# Patient Record
Sex: Female | Born: 1980 | Race: White | Hispanic: No | Marital: Married | Smoking: Never smoker
Health system: Southern US, Community
[De-identification: ages and names within clinical notes are randomized; demographics above are authoritative.]

## PROBLEM LIST (undated history)

## (undated) ENCOUNTER — Inpatient Hospital Stay (HOSPITAL_COMMUNITY): Payer: Self-pay

## (undated) DIAGNOSIS — J45909 Unspecified asthma, uncomplicated: Secondary | ICD-10-CM

## (undated) DIAGNOSIS — Z8619 Personal history of other infectious and parasitic diseases: Secondary | ICD-10-CM

## (undated) DIAGNOSIS — E039 Hypothyroidism, unspecified: Secondary | ICD-10-CM

## (undated) HISTORY — PX: OTHER SURGICAL HISTORY: SHX169

## (undated) HISTORY — DX: Personal history of other infectious and parasitic diseases: Z86.19

## (undated) HISTORY — DX: Hypothyroidism, unspecified: E03.9

## (undated) HISTORY — DX: Unspecified asthma, uncomplicated: J45.909

---
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
## 2007-01-17 ENCOUNTER — Emergency Department (HOSPITAL_COMMUNITY): Admission: EM | Admit: 2007-01-17 | Discharge: 2007-01-17 | Payer: Self-pay | Admitting: Emergency Medicine

## 2008-12-09 ENCOUNTER — Emergency Department (HOSPITAL_COMMUNITY): Admission: EM | Admit: 2008-12-09 | Discharge: 2008-12-09 | Payer: Self-pay | Admitting: Family Medicine

## 2013-05-01 LAB — HM PAP SMEAR

## 2013-07-24 ENCOUNTER — Emergency Department: Payer: Self-pay | Admitting: Emergency Medicine

## 2013-07-24 LAB — CBC WITH DIFFERENTIAL/PLATELET
Basophil #: 0.1 10*3/uL (ref 0.0–0.1)
Basophil %: 0.4 %
EOS ABS: 0.1 10*3/uL (ref 0.0–0.7)
Eosinophil %: 0.5 %
HCT: 41 % (ref 35.0–47.0)
HGB: 14.1 g/dL (ref 12.0–16.0)
LYMPHS ABS: 2.3 10*3/uL (ref 1.0–3.6)
Lymphocyte %: 14.9 %
MCH: 30.2 pg (ref 26.0–34.0)
MCHC: 34.3 g/dL (ref 32.0–36.0)
MCV: 88 fL (ref 80–100)
MONO ABS: 1 x10 3/mm — AB (ref 0.2–0.9)
MONOS PCT: 6.8 %
NEUTROS ABS: 11.8 10*3/uL — AB (ref 1.4–6.5)
NEUTROS PCT: 77.4 %
PLATELETS: 212 10*3/uL (ref 150–440)
RBC: 4.65 10*6/uL (ref 3.80–5.20)
RDW: 12.4 % (ref 11.5–14.5)
WBC: 15.3 10*3/uL — ABNORMAL HIGH (ref 3.6–11.0)

## 2013-07-24 LAB — BASIC METABOLIC PANEL
ANION GAP: 5 — AB (ref 7–16)
BUN: 11 mg/dL (ref 7–18)
CALCIUM: 9 mg/dL (ref 8.5–10.1)
Chloride: 103 mmol/L (ref 98–107)
Co2: 27 mmol/L (ref 21–32)
Creatinine: 0.76 mg/dL (ref 0.60–1.30)
EGFR (African American): >60
EGFR (Non-African Amer.): >60
GLUCOSE: 99 mg/dL (ref 65–99)
Osmolality: 270 (ref 275–301)
POTASSIUM: 3.5 mmol/L (ref 3.5–5.1)
SODIUM: 135 mmol/L — AB (ref 136–145)

## 2013-07-24 LAB — URINALYSIS, COMPLETE
BILIRUBIN, UR: NEGATIVE
GLUCOSE, UR: NEGATIVE mg/dL (ref 0–75)
Ketone: NEGATIVE
NITRITE: NEGATIVE
PH: 6 (ref 4.5–8.0)
PROTEIN: NEGATIVE
Specific Gravity: 1.011 (ref 1.003–1.030)
Squamous Epithelial: 15

## 2013-07-24 LAB — MONONUCLEOSIS SCREEN: Mono Test: NEGATIVE

## 2013-07-25 ENCOUNTER — Emergency Department: Payer: Self-pay | Admitting: Emergency Medicine

## 2013-07-27 LAB — BETA STREP CULTURE(ARMC)

## 2013-07-29 LAB — CULTURE, BLOOD (SINGLE)

## 2013-09-20 ENCOUNTER — Inpatient Hospital Stay (HOSPITAL_COMMUNITY): Payer: No Typology Code available for payment source

## 2013-09-20 ENCOUNTER — Encounter (HOSPITAL_COMMUNITY): Payer: Self-pay | Admitting: Family

## 2013-09-20 ENCOUNTER — Inpatient Hospital Stay (HOSPITAL_COMMUNITY)
Admission: AD | Admit: 2013-09-20 | Discharge: 2013-09-20 | Disposition: A | Discharge status: Home / Self Care | Payer: No Typology Code available for payment source | Source: Ambulatory Visit | Attending: Obstetrics and Gynecology | Admitting: Obstetrics and Gynecology

## 2013-09-20 DIAGNOSIS — O021 Missed abortion: Secondary | ICD-10-CM | POA: Insufficient documentation

## 2013-09-20 LAB — URINE MICROSCOPIC-ADD ON

## 2013-09-20 LAB — URINALYSIS, ROUTINE W REFLEX MICROSCOPIC
Bilirubin Urine: NEGATIVE
GLUCOSE, UA: NEGATIVE mg/dL
Ketones, ur: NEGATIVE mg/dL
LEUKOCYTES UA: NEGATIVE
Nitrite: NEGATIVE
Protein, ur: NEGATIVE mg/dL
SPECIFIC GRAVITY, URINE: 1.01 (ref 1.005–1.030)
Urobilinogen, UA: 0.2 mg/dL (ref 0.0–1.0)
pH: 5.5 (ref 5.0–8.0)

## 2013-09-20 LAB — POCT PREGNANCY, URINE: PREG TEST UR: POSITIVE — AB

## 2013-09-20 LAB — ABO/RH: ABO/RH(D): AB POS

## 2013-09-20 MED ORDER — HYDROCODONE-ACETAMINOPHEN 5-325 MG PO TABS
1.0000 | ORAL_TABLET | Freq: Four times a day (QID) | ORAL | Status: DC | PRN
Start: 2013-09-20 — End: 2014-01-05

## 2013-09-20 NOTE — MAU Note (Signed)
33 yo presents to MAU with c/o vaginal bleeding since 1400 today. Reports she had spotting earlier in the week; last intercourse on Thursday.  She soaked a panty liner this afternoon. She reports pelvic discomfort and intermittent cramping.  She has been seen at [facility] and had an US performed; she was told her EDD is 04/25/2014.

## 2013-09-20 NOTE — MAU Provider Note (Signed)
History     CSN: [id number]  Arrival date and time: 09/20/13 1703   First Provider Initiated Contact with Patient 09/20/13 1749      Chief Complaint  Patient presents with  . Vaginal Bleeding   Vaginal Bleeding    Zoe [name] is a 33 y.o. G1P0 at [redacted]w[redacted]d who presents today with vaginal bleeding. She states that she has been seen at [facility], and had an ultrasound on 09/01/12. She states that was normal and was given an EDC os 04/25/14. She states that on Thursday she had some spotting, and today it was heavier. She states that it is similar to a heavy period. She denies any pain at this tIme. She states her blood type is AB+  History reviewed. No pertinent past medical history.  History reviewed. No pertinent past surgical history.  History reviewed. No pertinent family history.  History  Substance Use Topics  . Smoking status: Never Smoker   . Smokeless tobacco: Not on file  . Alcohol Use: No    Allergies: No Known Allergies  Prescriptions prior to admission  Medication Sig Dispense Refill  . Docosahexaenoic Acid (DHA PO) Take 1 capsule by mouth daily.      . folic acid (FOLVITE) 093 MCG tablet Take 2,400 mcg by mouth daily.      . Prenatal Vit-Fe Fumarate-FA (PRENATAL MULTIVITAMIN) TABS tablet Take 1 tablet by mouth daily at 12 noon.        Review of Systems  Genitourinary: Positive for vaginal bleeding.   Physical Exam   Blood pressure 123/73, pulse 71, temperature 98.2 F (36.8 C), temperature source Oral, resp. rate 14, last menstrual period 05/30/2013.  Physical Exam  Nursing note and vitals reviewed. Constitutional: She is oriented to person, place, and time. She appears well-developed and well-nourished. No distress.  Cardiovascular: Normal rate.   Respiratory: Effort normal.  GI: Soft. There is no tenderness.  Neurological: She is alert and oriented to person, place, and time.  Skin: Skin is warm and dry.  Psychiatric: She has a normal  mood and affect.    MAU Course  Procedures  Results for orders placed during the hospital encounter of 09/20/13 (from the past 24 hour(s))  URINALYSIS, ROUTINE W REFLEX MICROSCOPIC     Status: Abnormal   Collection Time    09/20/13  5:23 PM      Result Value Ref Range   Color, Urine YELLOW  YELLOW   APPearance CLEAR  CLEAR   Specific Gravity, Urine 1.010  1.005 - 1.030   pH 5.5  5.0 - 8.0   Glucose, UA NEGATIVE  NEGATIVE mg/dL   Hgb urine dipstick LARGE (*) NEGATIVE   Bilirubin Urine NEGATIVE  NEGATIVE   Ketones, ur NEGATIVE  NEGATIVE mg/dL   Protein, ur NEGATIVE  NEGATIVE mg/dL   Urobilinogen, UA 0.2  0.0 - 1.0 mg/dL   Nitrite NEGATIVE  NEGATIVE   Leukocytes, UA NEGATIVE  NEGATIVE  URINE MICROSCOPIC-ADD ON     Status: Abnormal   Collection Time    09/20/13  5:23 PM      Result Value Ref Range   Squamous Epithelial / LPF FEW (*) RARE   WBC, UA 0-2  <3 WBC/hpf   RBC / HPF 0-2  <3 RBC/hpf   Bacteria, UA FEW (*) RARE  POCT PREGNANCY, URINE     Status: Abnormal   Collection Time    09/20/13  5:39 PM      Result Value Ref Range  Preg Test, Ur POSITIVE (*) NEGATIVE  ABO/RH     Status: None   Collection Time    09/20/13  6:10 PM      Result Value Ref Range   ABO/RH(D) AB POS     US Ob Comp Less 14 Wks  09/20/2013   CLINICAL DATA:  Pregnant, vaginal bleeding, assess viability ; assigned EGA [redacted] weeks 0 days  EXAM: OBSTETRIC <14 WK [place] AND TRANSVAGINAL OB US  TECHNIQUE: Both transabdominal and transvaginal ultrasound examinations were performed for complete evaluation of the gestation as well as the maternal uterus, adnexal regions, and pelvic cul-de-sac. Transvaginal technique was performed to assess early pregnancy.  COMPARISON:  None  FINDINGS: Intrauterine gestational sac: Visualized/normal in shape.  Yolk sac:  Not visualized  Embryo:  Present  Cardiac Activity: Absent  Heart Rate:  N/A bpm  CRL:   15.7  mm   8 w 0 d  Maternal uterus/adnexae: No subchorionic hemorrhage.  Ovaries  normal sizes and morphology.  No free pelvic fluid.  No adnexal masses.  IMPRESSION: Single intrauterine gestation is present.  However, no fetal cardiac activity identified.  Findings meet definitive criteria for failed pregnancy.  This follows SRU consensus guidelines: Diagnostic Criteria for Nonviable Pregnancy Early in the First Trimester. Alison Stalling J Med [phone].   Electronically Signed   By: Lavonia Dana M.D.   On: 09/20/2013 18:56   US Ob Transvaginal  09/20/2013   CLINICAL DATA:  Pregnant, vaginal bleeding, assess viability ; assigned EGA [redacted] weeks 0 days  EXAM: OBSTETRIC <14 WK [place] AND TRANSVAGINAL OB US  TECHNIQUE: Both transabdominal and transvaginal ultrasound examinations were performed for complete evaluation of the gestation as well as the maternal uterus, adnexal regions, and pelvic cul-de-sac. Transvaginal technique was performed to assess early pregnancy.  COMPARISON:  None  FINDINGS: Intrauterine gestational sac: Visualized/normal in shape.  Yolk sac:  Not visualized  Embryo:  Present  Cardiac Activity: Absent  Heart Rate:  N/A bpm  CRL:   15.7  mm   8 w 0 d  Maternal uterus/adnexae: No subchorionic hemorrhage.  Ovaries normal sizes and morphology.  No free pelvic fluid.  No adnexal masses.  IMPRESSION: Single intrauterine gestation is present.  However, no fetal cardiac activity identified.  Findings meet definitive criteria for failed pregnancy.  This follows SRU consensus guidelines: Diagnostic Criteria for Nonviable Pregnancy Early in the First Trimester. Alison Stalling J Med [phone].   Electronically Signed   By: Lavonia Dana M.D.   On: 09/20/2013 18:56     1806: D/W Dr. Helane Rima, will send for [place] to confirm viability. Will also send AB/RH.  1906: D/W Dr. Helane Rima, ok for dc home at this time. Patient can FU with the office on Monday for further evaluation and management.   Assessment and Plan   1. Missed abortion    Bleeding precations Return to MAU as needed   Medication List          DHA PO  Take 1 capsule by mouth daily.     folic acid 623 MCG tablet  Commonly known as:  FOLVITE  Take 2,400 mcg by mouth daily.     HYDROcodone-acetaminophen 5-325 MG per tablet  Commonly known as:  NORCO/VICODIN  Take 1 tablet by mouth every 6 (six) hours as needed for moderate pain.     prenatal multivitamin Tabs tablet  Take 1 tablet by mouth daily at 12 noon.       Follow-up Information   Follow  up with [facility] (Call Monday morning for an appointment. They will work you in that day. )    Contact information:   [place] [id number] [place], [place] 09/20/2013, 5:59 PM

## 2013-09-20 NOTE — Discharge Instructions (Signed)
Miscarriage A miscarriage is the sudden loss of an unborn baby (fetus) before the 20th week of pregnancy. Most miscarriages happen in the first 3 months of pregnancy. Sometimes, it happens before a woman even knows she is pregnant. A miscarriage is also called a "spontaneous miscarriage" or "early pregnancy loss." Having a miscarriage can be an emotional experience. Talk with your caregiver about any questions you may have about miscarrying, the grieving process, and your future pregnancy plans. CAUSES   Problems with the fetal chromosomes that make it impossible for the baby to develop normally. Problems with the baby's genes or chromosomes are most often the result of errors that occur, by chance, as the embryo divides and grows. The problems are not inherited from the parents.  Infection of the cervix or uterus.   Hormone problems.   Problems with the cervix, such as having an incompetent cervix. This is when the tissue in the cervix is not strong enough to hold the pregnancy.   Problems with the uterus, such as an abnormally shaped uterus, uterine fibroids, or congenital abnormalities.   Certain medical conditions.   Smoking, drinking alcohol, or taking illegal drugs.   Trauma.  Often, the cause of a miscarriage is unknown.  SYMPTOMS   Vaginal bleeding or spotting, with or without cramps or pain.  Pain or cramping in the abdomen or lower back.  Passing fluid, tissue, or blood clots from the vagina. DIAGNOSIS  Your caregiver will perform a physical exam. You may also have an ultrasound to confirm the miscarriage. Blood or urine tests may also be ordered. TREATMENT   Sometimes, treatment is not necessary if you naturally pass all the fetal tissue that was in the uterus. If some of the fetus or placenta remains in the body (incomplete miscarriage), tissue left behind may become infected and must be removed. Usually, a dilation and curettage (D and C) procedure is performed.  During a D and C procedure, the cervix is widened (dilated) and any remaining fetal or placental tissue is gently removed from the uterus.  Antibiotic medicines are prescribed if there is an infection. Other medicines may be given to reduce the size of the uterus (contract) if there is a lot of bleeding.  If you have Rh negative blood and your baby was Rh positive, you will need a Rh immunoglobulin shot. This shot will protect any future baby from having Rh blood problems in future pregnancies. HOME CARE INSTRUCTIONS   Your caregiver may order bed rest or may allow you to continue light activity. Resume activity as directed by your caregiver.  Have someone help with home and family responsibilities during this time.   Keep track of the number of sanitary pads you use each day and how soaked (saturated) they are. Write down this information.   Do not use tampons. Do not douche or have sexual intercourse until approved by your caregiver.   Only take over-the-counter or prescription medicines for pain or discomfort as directed by your caregiver.   Do not take aspirin. Aspirin can cause bleeding.   Keep all follow-up appointments with your caregiver.   If you or your partner have problems with grieving, talk to your caregiver or seek counseling to help cope with the pregnancy loss. Allow enough time to grieve before trying to get pregnant again.  SEEK IMMEDIATE MEDICAL CARE IF:   You have severe cramps or pain in your back or abdomen.  You have a fever.  You pass large blood clots (walnut-sized  or larger) ortissue from your vagina. Save any tissue for your caregiver to inspect.   Your bleeding increases.   You have a thick, bad-smelling vaginal discharge.  You become lightheaded, weak, or you faint.   You have chills.  MAKE SURE YOU:  Understand these instructions.  Will watch your condition.  Will get help right away if you are not doing well or get  worse. Document Released: 12/13/2000 Document Revised: 10/14/2012 Document Reviewed: 08/08/2011 Exit[employer] Patient Information 2014 [employer].  

## 2013-11-11 HISTORY — PX: OTHER SURGICAL HISTORY: SHX169

## 2013-12-23 ENCOUNTER — Encounter (HOSPITAL_COMMUNITY): Payer: Self-pay

## 2014-01-05 ENCOUNTER — Ambulatory Visit (INDEPENDENT_AMBULATORY_CARE_PROVIDER_SITE_OTHER): Payer: No Typology Code available for payment source | Admitting: Family Medicine

## 2014-01-05 ENCOUNTER — Encounter (INDEPENDENT_AMBULATORY_CARE_PROVIDER_SITE_OTHER): Payer: Self-pay

## 2014-01-05 ENCOUNTER — Encounter: Payer: Self-pay | Admitting: Family Medicine

## 2014-01-05 VITALS — BP 136/80 | HR 62 | Temp 98.2°F | Ht 71.5 in | Wt 192.5 lb

## 2014-01-05 DIAGNOSIS — Z23 Encounter for immunization: Secondary | ICD-10-CM

## 2014-01-05 DIAGNOSIS — R238 Other skin changes: Secondary | ICD-10-CM

## 2014-01-05 DIAGNOSIS — N926 Irregular menstruation, unspecified: Secondary | ICD-10-CM

## 2014-01-05 DIAGNOSIS — R208 Other disturbances of skin sensation: Secondary | ICD-10-CM

## 2014-01-05 DIAGNOSIS — K625 Hemorrhage of anus and rectum: Secondary | ICD-10-CM

## 2014-01-05 NOTE — Patient Instructions (Signed)
Let me get your old records assembled and then we'll go from there.  Take care.  Glad to see you today.

## 2014-01-05 NOTE — Progress Notes (Signed)
Pre visit review using our clinic review tool, if applicable. No additional management support is needed unless otherwise documented below in the visit note.  New patient.    Persistently cold hands and feet.  Bilateral sx.  Not painful, but noted by patient and others.  W/o visible changes noted.  No other regional sx.    She has a h/o irregular periods.  Has seen OBGYN clinic prev.  H/o miscarriage earlier this year.  Wants to get pregnant, so wouldn't be a good candidate for OCPs.  D/w pt.  Requesting records.   Due for tetanus shot.    H/o mild exercise induced asthma sx, but never needed inhaler treatment.  H/o PNA mult times in childhood, none recently.    H/o blood in stool.  BRBPR.  No black stools.  No abd pain.  Seems to be diet related, after eating more carbs.  Going on episodically for years.    PMH and SH reviewed  ROS: See HPI, otherwise noncontributory.  Meds, vitals, and allergies reviewed.   GEN: nad, alert and oriented HEENT: mucous membranes moist NECK: supple w/o LA CV: rrr.  no murmur PULM: ctab, no inc wob ABD: soft, +bs EXT: no edema SKIN: no acute rash

## 2014-01-06 ENCOUNTER — Encounter: Payer: Self-pay | Admitting: Family Medicine

## 2014-01-06 DIAGNOSIS — N926 Irregular menstruation, unspecified: Secondary | ICD-10-CM | POA: Insufficient documentation

## 2014-01-06 DIAGNOSIS — K625 Hemorrhage of anus and rectum: Secondary | ICD-10-CM | POA: Insufficient documentation

## 2014-01-06 DIAGNOSIS — R208 Other disturbances of skin sensation: Secondary | ICD-10-CM | POA: Insufficient documentation

## 2014-01-06 NOTE — Assessment & Plan Note (Signed)
I want to get her old records and then proceed from there. She agrees.  We didn't intervene today.

## 2014-01-06 NOTE — Assessment & Plan Note (Signed)
No black stools. No abd pain. Seems to be diet related, after eating more carbs. Going on episodically for years.  I want to get her old records and then proceed from there.  She agrees.

## 2014-01-06 NOTE — Assessment & Plan Note (Signed)
She could have a raynaud's phenomena.  D/w pt.  Normal pulses and cap refill.  Wouldn't intervene at this point.

## 2014-01-08 ENCOUNTER — Encounter: Payer: Self-pay | Admitting: Family Medicine

## 2014-01-19 ENCOUNTER — Telehealth: Payer: Self-pay | Admitting: Family Medicine

## 2014-01-19 ENCOUNTER — Encounter: Payer: Self-pay | Admitting: Family Medicine

## 2014-01-19 DIAGNOSIS — R209 Unspecified disturbances of skin sensation: Secondary | ICD-10-CM

## 2014-01-19 DIAGNOSIS — K921 Melena: Secondary | ICD-10-CM

## 2014-01-19 NOTE — Telephone Encounter (Signed)
Left message on patient's voicemail to return call

## 2014-01-19 NOTE — Telephone Encounter (Signed)
Call pt.  3 issues: With the cold sensation in her extremities, we should check a TSH.  I didn't seen one in the records.  Order is in.  I didn't seen w/u for the irregular menses.  If that continues, then I want her to talk with GYN.  She has had mult episodes of blood in stool, w/o any noted eval prev in the records.  I think it is reasonable to have her talk to GI.  I'll put in the referral if she'll consent to going.  Thanks.

## 2014-01-19 NOTE — Telephone Encounter (Signed)
Patient advised.  Lab appt scheduled. Patient will contact her GYN for irregular menses.  Patient would like a GI referral.

## 2014-01-20 NOTE — Telephone Encounter (Signed)
Ordered. Thanks

## 2014-01-29 ENCOUNTER — Other Ambulatory Visit (INDEPENDENT_AMBULATORY_CARE_PROVIDER_SITE_OTHER): Payer: No Typology Code available for payment source

## 2014-01-29 DIAGNOSIS — R238 Other skin changes: Secondary | ICD-10-CM

## 2014-01-29 DIAGNOSIS — R209 Unspecified disturbances of skin sensation: Secondary | ICD-10-CM

## 2014-01-29 LAB — TSH: TSH: 3.49 u[IU]/mL (ref 0.35–4.50)

## 2014-02-12 ENCOUNTER — Encounter: Payer: Self-pay | Admitting: Gastroenterology

## 2014-02-12 ENCOUNTER — Ambulatory Visit (INDEPENDENT_AMBULATORY_CARE_PROVIDER_SITE_OTHER): Payer: No Typology Code available for payment source | Admitting: Gastroenterology

## 2014-02-12 VITALS — BP 100/60 | HR 66 | Ht 71.5 in | Wt 186.6 lb

## 2014-02-12 DIAGNOSIS — K5900 Constipation, unspecified: Secondary | ICD-10-CM

## 2014-02-12 DIAGNOSIS — K649 Unspecified hemorrhoids: Secondary | ICD-10-CM

## 2014-02-12 DIAGNOSIS — K625 Hemorrhage of anus and rectum: Secondary | ICD-10-CM

## 2014-02-12 MED ORDER — HYDROCORTISONE ACETATE 25 MG RE SUPP: 25.0000 mg | Freq: Two times a day (BID) | RECTAL | Status: DC

## 2014-02-12 NOTE — Patient Instructions (Signed)
We have sent the following medications to your pharmacy for you to pick up at your convenience: Anusol Suppository, please insert one suppository rectally twice daily for ten days.  Please purchase Miralax over the counter and take as directed once daily
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
## 2014-05-04 ENCOUNTER — Encounter: Payer: Self-pay | Admitting: Gastroenterology

## 2014-07-03 NOTE — L&D Delivery Note (Signed)
Delivery Note At 2:23 AM a viable female was delivered via Vaginal, Spontaneous Delivery (Presentation: ;  ).  APGAR: 9 9 weight pending  Placenta status: spontaneously , .  Cord:  with the following complications:none .  Cord pH: not obtained  Anesthesia: None  Episiotomy:  none Lacerations: 2nd with left sulcus  Suture Repair: 2.0 3.0 chromic vicryl Est. Blood Loss (mL):  300  Mom to postpartum.  Baby to Couplet care / Skin to Skin.  Sevag Shearn L 03/21/2015, 2:42 AM

## 2014-08-19 LAB — OB RESULTS CONSOLE HEPATITIS B SURFACE ANTIGEN: Hepatitis B Surface Ag: NEGATIVE

## 2014-08-19 LAB — OB RESULTS CONSOLE GC/CHLAMYDIA
Chlamydia: NEGATIVE
GC PROBE AMP, GENITAL: NEGATIVE

## 2014-08-19 LAB — OB RESULTS CONSOLE HIV ANTIBODY (ROUTINE TESTING): HIV: NONREACTIVE

## 2014-08-19 LAB — OB RESULTS CONSOLE ABO/RH: RH Type: POSITIVE

## 2014-08-19 LAB — OB RESULTS CONSOLE RPR: RPR: NONREACTIVE

## 2014-08-19 LAB — OB RESULTS CONSOLE ANTIBODY SCREEN: Antibody Screen: NEGATIVE

## 2014-08-19 LAB — OB RESULTS CONSOLE RUBELLA ANTIBODY, IGM: Rubella: IMMUNE

## 2015-02-02 IMAGING — CT CT NECK WITH CONTRAST
3 of 5 series · 12 of 33 positions shown, 14 images · IV contrast (isovue)
Comparison: None.

CLINICAL DATA: Sore throat

EXAM:
CT NECK WITH CONTRAST
TECHNIQUE: Multidetector CT imaging of the neck was performed using the
standard protocol following the bolus administration of intravenous
contrast.
CONTRAST:  75 cc of Isovue 370

[Series 4: sag neck · sagittal · 0.46mm/px · 5 of 74 slices shown, 6 images]
[im 25/74  bone]
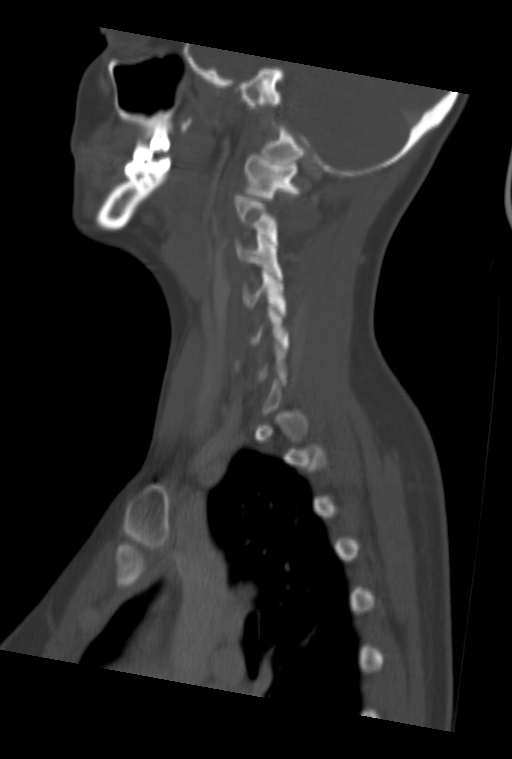
[im 31/74  bone]
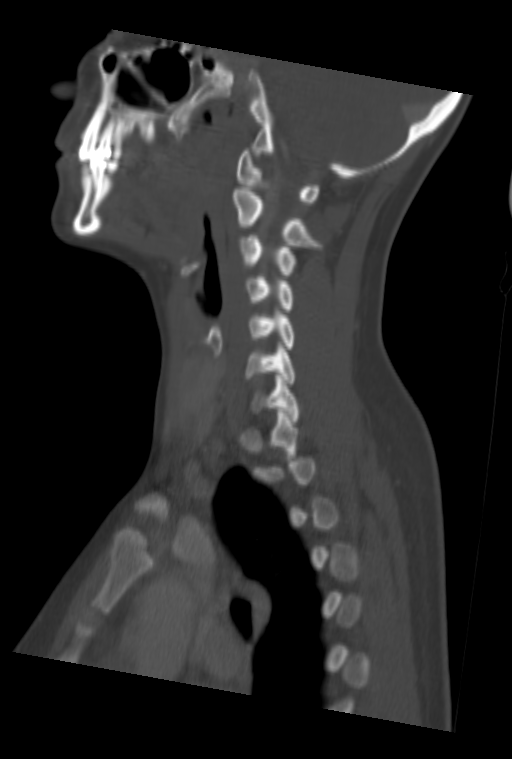
[im 37/74  soft-tissue]
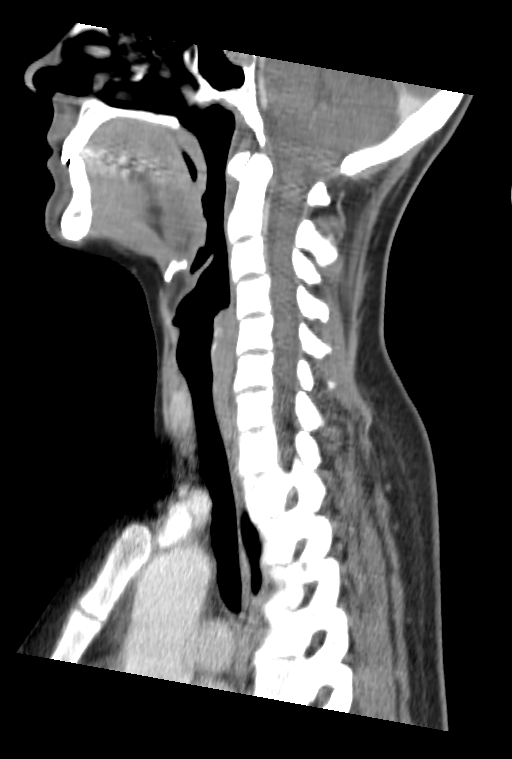
[im 37/74  bone]
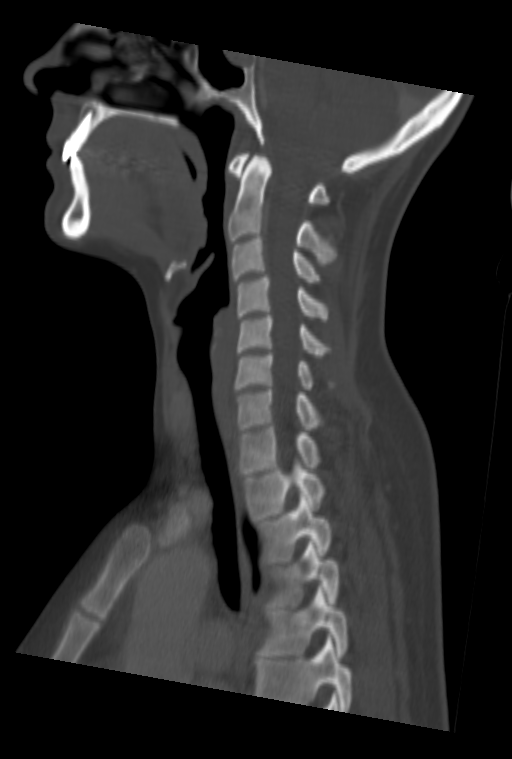
[im 43/74  bone]
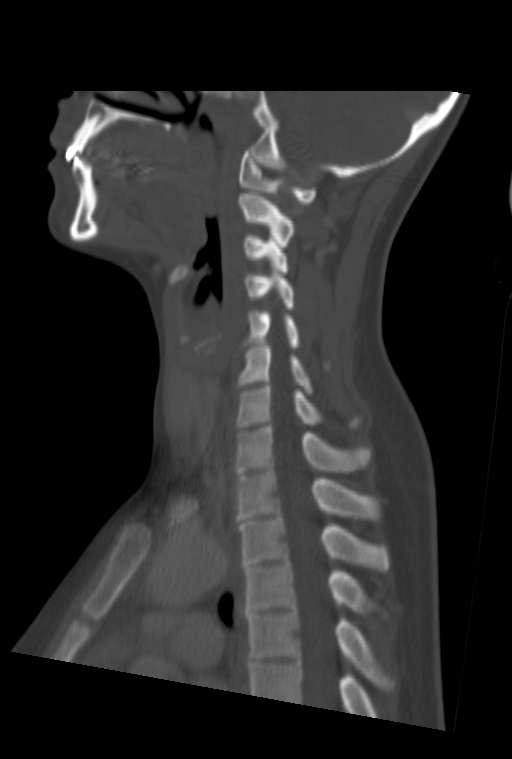
[im 49/74  bone]
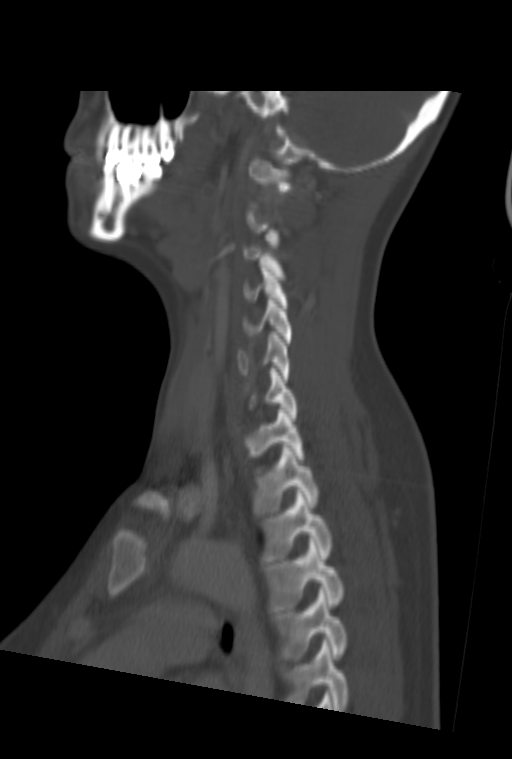

[Series 5: cor neck · coronal · 0.68mm/px · 3 of 93 slices shown]
[im 19/93  bone]
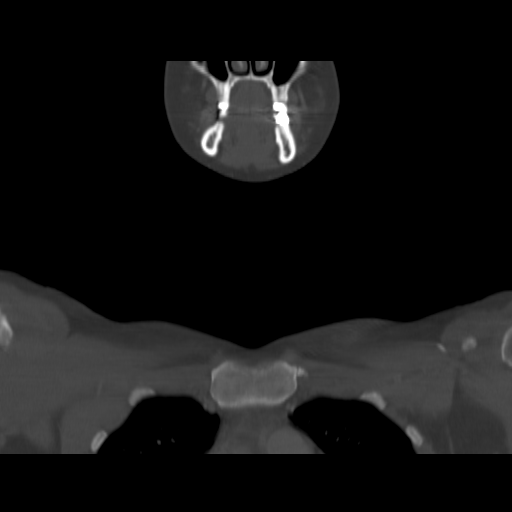
[im 37/93  bone]
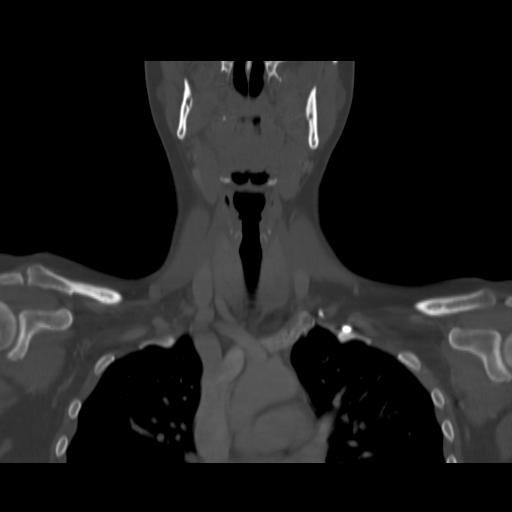
[im 56/93  bone]
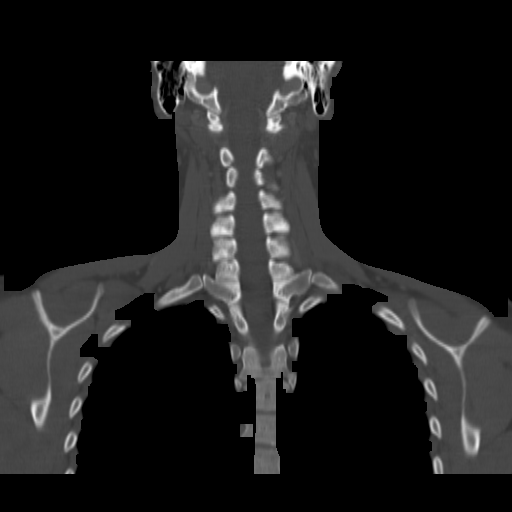

[Series 6: ax oropharynx · axial · 0.44mm/px · z∈[-345,-169]mm · 4 of 151 slices shown, 5 images]
[im 31/151  soft-tissue]
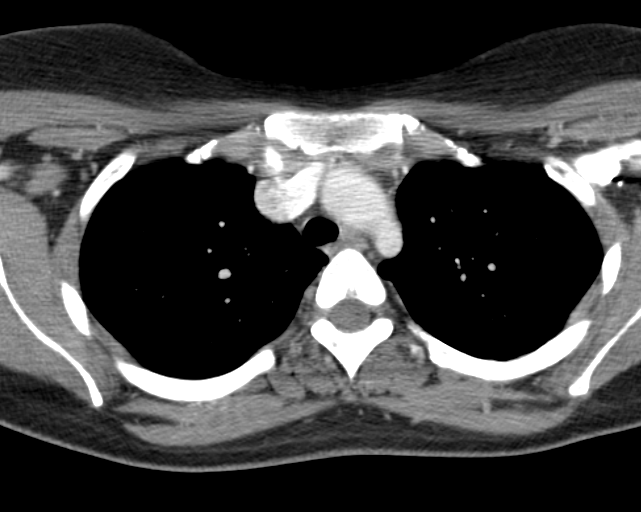
[im 31/151  bone]
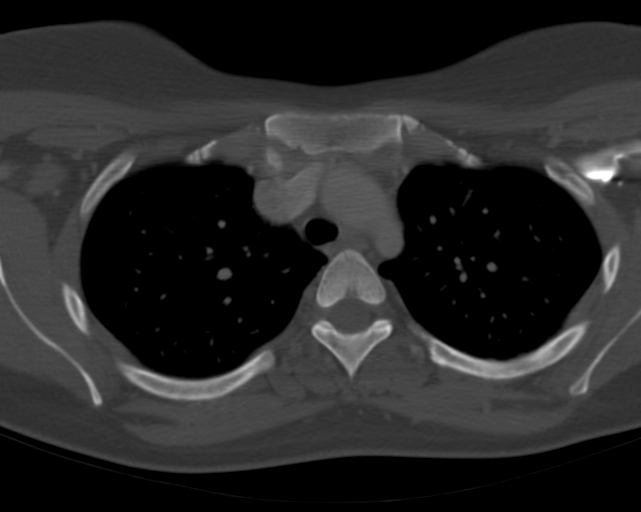
[im 61/151  bone]
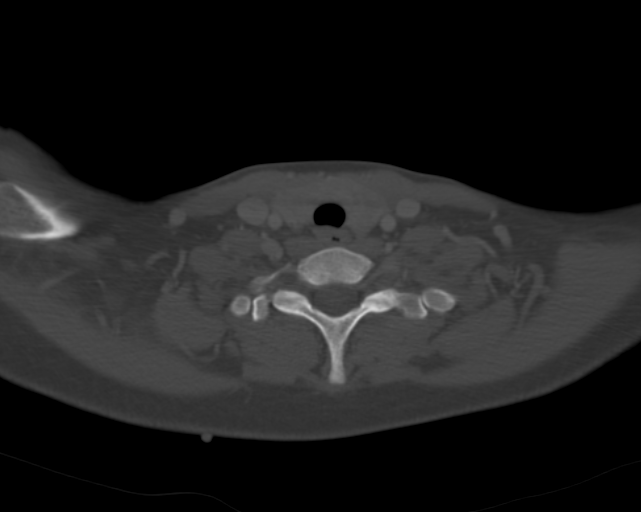
[im 91/151  bone]
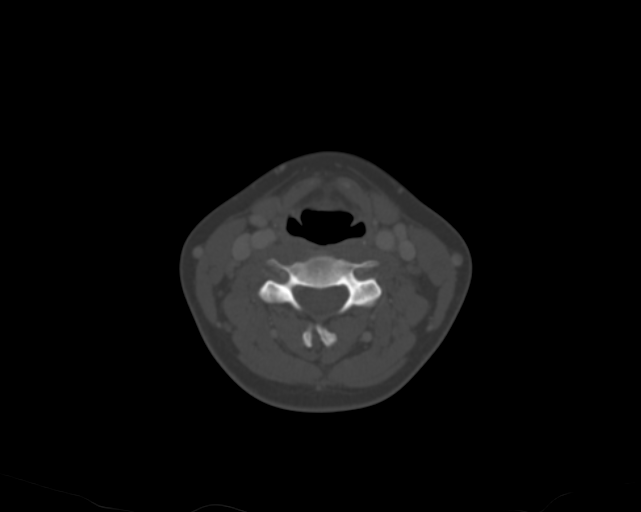
[im 121/151  bone]
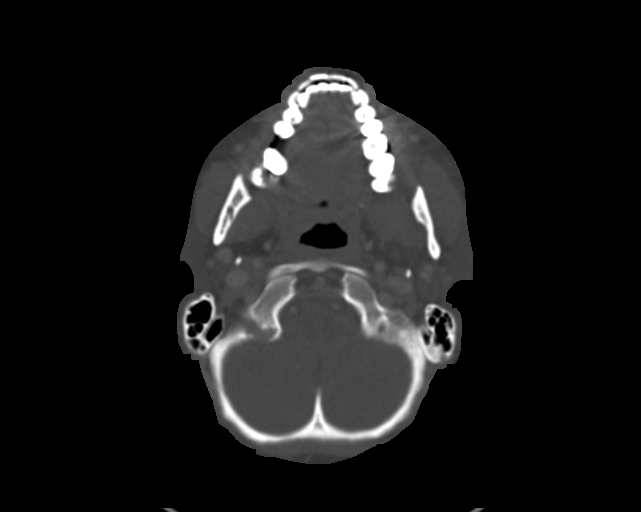

[12 of 33 positions shown; findings below may reference images not displayed]

FINDINGS: Visualized portions of the brain are unremarkable. Visualized orbits
are within normal limits.

Paranasal sinuses are clear.

The salivary glands including the parotid glands and submandibular
glands are normal.

Oral cavity is within normal limits without evidence of mass lesion
or loculated fluid collection. Scattered dental caries are noted.
Oropharynx is within normal limits. The palatine tonsils are
symmetric bilaterally. Subcentimeter calcified tonsillith noted
within the right palatine tonsil. The lingual tonsils and adenoids
are unremarkable. The vallecula is clear. Epiglottis is normal.
Nasopharynx is within normal limits. The hypopharynx and
supraglottic larynx are normal. True vocal cords are symmetric
bilaterally. The subglottic airway is clear. Retropharyngeal soft
tissues are normal.

The thyroid demonstrates a normal contrast enhanced appearance.

The visualized superior mediastinum is within normal limits.

The visualized lung apices are clear.

Normal intravascular enhancement is seen throughout the neck.

No pathologically enlarged lymph nodes are identified within the
neck (series 2, image 26). There is mild asymmetric inflammatory fat
stranding within the left masticator space, indeterminate, but may
represent cellulitis. No loculated fluid collection identified.

Mild reversal of the normal cervical lordosis with apex at C4 is
noted. Osseous structures are otherwise unremarkable. No focal lytic
or blastic osseous lesions are identified.
IMPRESSION: Mild asymmetric inflammatory fat stranding within the left
masticator space/left face, which may represent mild cellulitis.
Correlation with physical exam is recommended. No loculated fluid
collection to suggest abscess. Otherwise unremarkable CT of the
neck.

## 2015-02-23 LAB — OB RESULTS CONSOLE GBS: STREP GROUP B AG: NEGATIVE

## 2015-03-20 ENCOUNTER — Inpatient Hospital Stay (HOSPITAL_COMMUNITY)
Admission: AD | Admit: 2015-03-20 | Discharge: 2015-03-22 | DRG: 775 | Disposition: A | Discharge status: Home / Self Care | Payer: 59 | Source: Ambulatory Visit | Attending: Obstetrics and Gynecology | Admitting: Obstetrics and Gynecology

## 2015-03-20 ENCOUNTER — Encounter (HOSPITAL_COMMUNITY): Payer: Self-pay | Admitting: *Deleted

## 2015-03-20 DIAGNOSIS — Z800 Family history of malignant neoplasm of digestive organs: Secondary | ICD-10-CM | POA: Diagnosis not present

## 2015-03-20 DIAGNOSIS — Z8249 Family history of ischemic heart disease and other diseases of the circulatory system: Secondary | ICD-10-CM

## 2015-03-20 DIAGNOSIS — J45909 Unspecified asthma, uncomplicated: Secondary | ICD-10-CM | POA: Diagnosis present

## 2015-03-20 DIAGNOSIS — R51 Headache: Secondary | ICD-10-CM | POA: Diagnosis present

## 2015-03-20 DIAGNOSIS — Z801 Family history of malignant neoplasm of trachea, bronchus and lung: Secondary | ICD-10-CM

## 2015-03-20 DIAGNOSIS — Z3A40 40 weeks gestation of pregnancy: Secondary | ICD-10-CM | POA: Diagnosis present

## 2015-03-20 DIAGNOSIS — O9952 Diseases of the respiratory system complicating childbirth: Secondary | ICD-10-CM | POA: Diagnosis present

## 2015-03-20 DIAGNOSIS — IMO0001 Reserved for inherently not codable concepts without codable children: Secondary | ICD-10-CM

## 2015-03-20 DIAGNOSIS — O480 Post-term pregnancy: Principal | ICD-10-CM | POA: Diagnosis present

## 2015-03-20 LAB — CBC
HCT: 33 % — ABNORMAL LOW (ref 36.0–46.0)
Hemoglobin: 11.6 g/dL — ABNORMAL LOW (ref 12.0–15.0)
MCH: 32 pg (ref 26.0–34.0)
MCHC: 35.2 g/dL (ref 30.0–36.0)
MCV: 90.9 fL (ref 78.0–100.0)
PLATELETS: 176 10*3/uL (ref 150–400)
RBC: 3.63 MIL/uL — AB (ref 3.87–5.11)
RDW: 13.8 % (ref 11.5–15.5)
WBC: 10.8 10*3/uL — AB (ref 4.0–10.5)

## 2015-03-20 LAB — URINALYSIS, ROUTINE W REFLEX MICROSCOPIC
Bilirubin Urine: NEGATIVE
Glucose, UA: NEGATIVE mg/dL
Ketones, ur: NEGATIVE mg/dL
Nitrite: NEGATIVE
Protein, ur: NEGATIVE mg/dL
SPECIFIC GRAVITY, URINE: 1.01 (ref 1.005–1.030)
UROBILINOGEN UA: 0.2 mg/dL (ref 0.0–1.0)
pH: 6.5 (ref 5.0–8.0)

## 2015-03-20 LAB — TYPE AND SCREEN
ABO/RH(D): AB POS
Antibody Screen: NEGATIVE

## 2015-03-20 LAB — URINE MICROSCOPIC-ADD ON

## 2015-03-20 MED ORDER — OXYCODONE-ACETAMINOPHEN 5-325 MG PO TABS: 1.0000 | ORAL_TABLET | ORAL | Status: DC | PRN

## 2015-03-20 MED ORDER — OXYCODONE-ACETAMINOPHEN 5-325 MG PO TABS: 2.0000 | ORAL_TABLET | ORAL | Status: DC | PRN

## 2015-03-20 MED ORDER — ONDANSETRON HCL 4 MG/2ML IJ SOLN: 4.0000 mg | Freq: Four times a day (QID) | INTRAMUSCULAR | Status: DC | PRN

## 2015-03-20 MED ORDER — OXYTOCIN BOLUS FROM INFUSION
500.0000 mL | INTRAVENOUS | Status: DC
  Administered 2015-03-21: 500 mL via INTRAVENOUS

## 2015-03-20 MED ORDER — OXYTOCIN 40 UNITS IN LACTATED RINGERS INFUSION - SIMPLE MED
62.5000 mL/h | INTRAVENOUS | Status: DC
  Administered 2015-03-21: 62.5 mL/h via INTRAVENOUS
  Filled 2015-03-20: qty 1000

## 2015-03-20 MED ORDER — FLEET ENEMA 7-19 GM/118ML RE ENEM: 1.0000 | ENEMA | RECTAL | Status: DC | PRN

## 2015-03-20 MED ORDER — LIDOCAINE HCL (PF) 1 % IJ SOLN
30.0000 mL | INTRAMUSCULAR | Status: DC | PRN
  Filled 2015-03-20: qty 30

## 2015-03-20 MED ORDER — LACTATED RINGERS IV SOLN: INTRAVENOUS | Status: DC

## 2015-03-20 MED ORDER — LACTATED RINGERS IV SOLN: 500.0000 mL | INTRAVENOUS | Status: DC | PRN

## 2015-03-20 MED ORDER — ACETAMINOPHEN 325 MG PO TABS: 650.0000 mg | ORAL_TABLET | ORAL | Status: DC | PRN

## 2015-03-20 MED ORDER — CITRIC ACID-SODIUM CITRATE 334-500 MG/5ML PO SOLN: 30.0000 mL | ORAL | Status: DC | PRN

## 2015-03-20 NOTE — MAU Note (Signed)
Pt states U/C's started at 0700 today.  They have progressed and are now every 5 minutes since 1800.  Notes bloody show, no abnormal discharge or ROM.  States baby is moving but not as active.  Yesterday VE was 2 cm in office.
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
## 2015-03-21 ENCOUNTER — Encounter (HOSPITAL_COMMUNITY): Payer: Self-pay

## 2015-03-21 LAB — CBC
HEMATOCRIT: 29.9 % — AB (ref 36.0–46.0)
Hemoglobin: 10.2 g/dL — ABNORMAL LOW (ref 12.0–15.0)
MCH: 31.4 pg (ref 26.0–34.0)
MCHC: 34.1 g/dL (ref 30.0–36.0)
MCV: 92 fL (ref 78.0–100.0)
PLATELETS: 181 10*3/uL (ref 150–400)
RBC: 3.25 MIL/uL — AB (ref 3.87–5.11)
RDW: 13.8 % (ref 11.5–15.5)
WBC: 17.5 10*3/uL — AB (ref 4.0–10.5)

## 2015-03-21 LAB — RPR: RPR: NONREACTIVE

## 2015-03-21 MED ORDER — MEASLES, MUMPS & RUBELLA VAC ~~LOC~~ INJ
0.5000 mL | INJECTION | Freq: Once | SUBCUTANEOUS | Status: DC
  Filled 2015-03-21: qty 0.5

## 2015-03-21 MED ORDER — TETANUS-DIPHTH-ACELL PERTUSSIS 5-2.5-18.5 LF-MCG/0.5 IM SUSP: 0.5000 mL | Freq: Once | INTRAMUSCULAR | Status: DC

## 2015-03-21 MED ORDER — ZOLPIDEM TARTRATE 5 MG PO TABS: 5.0000 mg | ORAL_TABLET | Freq: Every evening | ORAL | Status: DC | PRN

## 2015-03-21 MED ORDER — ONDANSETRON HCL 4 MG PO TABS: 4.0000 mg | ORAL_TABLET | ORAL | Status: DC | PRN

## 2015-03-21 MED ORDER — BENZOCAINE-MENTHOL 20-0.5 % EX AERO
1.0000 | INHALATION_SPRAY | CUTANEOUS | Status: DC | PRN
Start: 2015-03-21 — End: 2015-03-22
  Administered 2015-03-21: 1 via TOPICAL
  Filled 2015-03-21: qty 56

## 2015-03-21 MED ORDER — BISACODYL 10 MG RE SUPP: 10.0000 mg | Freq: Every day | RECTAL | Status: DC | PRN

## 2015-03-21 MED ORDER — FLEET ENEMA 7-19 GM/118ML RE ENEM: 1.0000 | ENEMA | Freq: Every day | RECTAL | Status: DC | PRN

## 2015-03-21 MED ORDER — SENNOSIDES-DOCUSATE SODIUM 8.6-50 MG PO TABS
2.0000 | ORAL_TABLET | ORAL | Status: DC
  Administered 2015-03-22: 2 via ORAL
  Filled 2015-03-21: qty 2

## 2015-03-21 MED ORDER — DIBUCAINE 1 % RE OINT: 1.0000 "application " | TOPICAL_OINTMENT | RECTAL | Status: DC | PRN

## 2015-03-21 MED ORDER — MEDROXYPROGESTERONE ACETATE 150 MG/ML IM SUSP: 150.0000 mg | INTRAMUSCULAR | Status: DC | PRN

## 2015-03-21 MED ORDER — BUPIVACAINE HCL (PF) 0.25 % IJ SOLN
30.0000 mL | INTRAMUSCULAR | Status: DC | PRN
  Administered 2015-03-21: 30 mL
  Filled 2015-03-21 (×2): qty 30

## 2015-03-21 MED ORDER — SIMETHICONE 80 MG PO CHEW: 80.0000 mg | CHEWABLE_TABLET | ORAL | Status: DC | PRN

## 2015-03-21 MED ORDER — LANOLIN HYDROUS EX OINT: TOPICAL_OINTMENT | CUTANEOUS | Status: DC | PRN

## 2015-03-21 MED ORDER — ACETAMINOPHEN 325 MG PO TABS: 650.0000 mg | ORAL_TABLET | ORAL | Status: DC | PRN

## 2015-03-21 MED ORDER — DIPHENHYDRAMINE HCL 25 MG PO CAPS: 25.0000 mg | ORAL_CAPSULE | Freq: Four times a day (QID) | ORAL | Status: DC | PRN

## 2015-03-21 MED ORDER — WITCH HAZEL-GLYCERIN EX PADS: 1.0000 "application " | MEDICATED_PAD | CUTANEOUS | Status: DC | PRN

## 2015-03-21 MED ORDER — IBUPROFEN 600 MG PO TABS
600.0000 mg | ORAL_TABLET | Freq: Four times a day (QID) | ORAL | Status: DC
  Administered 2015-03-21: 600 mg via ORAL
  Filled 2015-03-21 (×6): qty 1

## 2015-03-21 MED ORDER — OXYCODONE-ACETAMINOPHEN 5-325 MG PO TABS: 1.0000 | ORAL_TABLET | ORAL | Status: DC | PRN

## 2015-03-21 MED ORDER — PRENATAL MULTIVITAMIN CH
1.0000 | ORAL_TABLET | Freq: Every day | ORAL | Status: DC
  Administered 2015-03-21 – 2015-03-22 (×2): 1 via ORAL
  Filled 2015-03-21 (×2): qty 1

## 2015-03-21 MED ORDER — OXYCODONE-ACETAMINOPHEN 5-325 MG PO TABS: 2.0000 | ORAL_TABLET | ORAL | Status: DC | PRN

## 2015-03-21 MED ORDER — ONDANSETRON HCL 4 MG/2ML IJ SOLN: 4.0000 mg | INTRAMUSCULAR | Status: DC | PRN

## 2015-03-21 NOTE — Lactation Note (Signed)
This note was copied from the chart of [facility]oe [facility]. Lactation Consultation Note  Patient Name: Zoe [name] [id number] Date: 03/21/2015 Reason for consult: Follow-up assessment;Difficult latch Mom reports baby is not latching today. Mom reports baby has been very spitty throughout the day. Now baby is awake and fussy acting hungry. Mom's nipples are slightly erect but with very short shaft bilateral, aerola edema present. Tried several positions and pre-pumping but baby could not sustain the latch. Baby has disorganized suck, tongue thrusting. Short posterior lingual frenulum noted. Demonstrated using hand pump to pre-pump to help with latch. Demonstrated hand expression and received approx 3 ml of colostrum, demonstrated spoon feeding this back to baby to give her an appetizer to see if she would latch. Baby still struggling obtaining/sustaining a latch. Initiated 16 nipple shield, baby could not organize her suck, changed to 24 and after few minutes baby did suckle off/on for about 5 minutes. Some colostrum in nipple shield but the 24 is large for Mom. Changed back to 16 and baby was able to develop a suckling pattern off/on for another 10 minutes then fell asleep. Mom has hand pump to pre-pump. Breast shells given to wear. Advised to watch for early feeding ques, Mom to pre-pump and see if baby will latch. If not use 16 nipple shield, look for colostrum in nipple shield with feeding. Discussed post pumping with using a nipple shield but will address this again in the am, Mom will use hand pump as needed tonight. Basic teaching reviewed, cluster feeding discussed. Lactation brochure left for review, advised of OP services and support group. Encouraged to call for assist as needed. If baby not latching tonight, advised to hand express and spoon feed, ask RN to set up DEBP if baby not latching.   Maternal Data Has patient been taught Hand Expression?: Yes Does the patient have breastfeeding  experience prior to this delivery?: No  Feeding Feeding Type: Breast Fed Length of feed: 15 min (off/on)  LATCH Score/Interventions Latch: Repeated attempts needed to sustain latch, nipple held in mouth throughout feeding, stimulation needed to elicit sucking reflex. (using nipple shield) Intervention(s): Skin to skin;Waking techniques Intervention(s): Adjust position;Assist with latch;Breast massage;Breast compression  Audible Swallowing: A few with stimulation Intervention(s): Skin to skin  Type of Nipple: Flat Intervention(s): Hand pump;Shells  Comfort (Breast/Nipple): Soft / non-tender     Hold (Positioning): Assistance needed to correctly position infant at breast and maintain latch. Intervention(s): Breastfeeding basics reviewed;Support Pillows;Position options;Skin to skin  LATCH Score: 6  Lactation Tools Discussed/Used Tools: Shells;Nipple [place] Fuel;Pump Nipple shield size: 16;24 Shell Type: Inverted Breast pump type: Manual WIC Program: No   Consult Status Consult Status: Follow-up Date: 03/22/15 Follow-up type: In-patient    Katrine Coho 03/21/2015, 10:45 PM

## 2015-03-22 NOTE — Discharge Summary (Signed)
Obstetric Discharge Summary Reason for Admission: onset of labor Prenatal Procedures: none Intrapartum Procedures: spontaneous vaginal delivery Postpartum Procedures: none Complications-Operative and Postpartum: none HEMOGLOBIN  Date Value Ref Range Status  03/21/2015 10.2* 12.0 - 15.0 g/dL Final   HGB  Date Value Ref Range Status  07/24/2013 14.1 12.0-16.0 g/dL Final   HCT  Date Value Ref Range Status  03/21/2015 29.9* 36.0 - 46.0 % Final  07/24/2013 41.0 35.0-47.0 % Final    Physical Exam:  General: alert, cooperative and no distress Lochia: appropriate Uterine Fundus: firm Incision: healing well DVT Evaluation: No evidence of DVT seen on physical exam.  Discharge Diagnoses: Term Pregnancy-delivered  Discharge Information: Date: 03/22/2015 Activity: pelvic rest Diet: routine Medications: None Condition: stable Instructions: refer to practice specific booklet Discharge to: home   Newborn Data: Live born female  Birth Weight: 8 lb (3629 g) APGAR: 9, 9  Home with mother.  LOWE,DAVID C 03/22/2015, 9:19 AM

## 2015-03-23 ENCOUNTER — Ambulatory Visit: Payer: Self-pay

## 2015-03-23 NOTE — Lactation Note (Signed)
This note was copied from the chart of [facility]. Lactation Consultation Note  Baby has had lots of short feedings through the night.  Encouraged mother to undress baby for feedings. Encouraged her to breast feed on both breasts burping in between. Reviewed engorgement care and monitoring voids/stools. Denies soreness or questions.  Patient Name: Zoe [name] [id number] Date: 03/23/2015 Reason for consult: Follow-up assessment   Maternal Data    Feeding Feeding Type: Breast Fed Length of feed: 14 min  LATCH Score/Interventions                      Lactation Tools Discussed/Used     Consult Status Consult Status: Complete    Carlye Grippe 03/23/2015, 8:41 AM

## 2015-03-26 ENCOUNTER — Inpatient Hospital Stay (HOSPITAL_COMMUNITY): Payer: No Typology Code available for payment source

## 2015-10-07 ENCOUNTER — Encounter: Payer: Self-pay | Admitting: Primary Care

## 2015-10-07 ENCOUNTER — Ambulatory Visit (INDEPENDENT_AMBULATORY_CARE_PROVIDER_SITE_OTHER): Payer: 59 | Admitting: Primary Care

## 2015-10-07 VITALS — BP 106/64 | HR 62 | Temp 97.8°F | Ht 71.5 in | Wt 191.4 lb

## 2015-10-07 DIAGNOSIS — R059 Cough, unspecified: Secondary | ICD-10-CM

## 2015-10-07 DIAGNOSIS — R05 Cough: Secondary | ICD-10-CM | POA: Diagnosis not present

## 2015-10-07 MED ORDER — AZITHROMYCIN 250 MG PO TABS
ORAL_TABLET | ORAL | Status: DC
Start: 2015-10-07 — End: 2016-01-26

## 2015-10-07 NOTE — Patient Instructions (Signed)
Start Azithromycin antibiotics. Take 2 tablets by mouth today, then 1 tablet daily for 4 additional days.  This medication could cause diarrhea, decrease in appetite, or rash to your baby.   Continue tylenol as needed for body aches.  Please notify me if you've had no improvement in your symptoms in 3-4 days.  Ensure you are staying hydrated with water.  It was a pleasure meeting you!

## 2015-10-07 NOTE — Progress Notes (Signed)
Pre visit review using our clinic review tool, if applicable. No additional management support is needed unless otherwise documented below in the visit note. 
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
## 2016-01-26 ENCOUNTER — Encounter: Payer: Self-pay | Admitting: Family Medicine

## 2016-01-26 ENCOUNTER — Ambulatory Visit (INDEPENDENT_AMBULATORY_CARE_PROVIDER_SITE_OTHER): Payer: 59 | Admitting: Family Medicine

## 2016-01-26 DIAGNOSIS — R05 Cough: Secondary | ICD-10-CM | POA: Diagnosis not present

## 2016-01-26 DIAGNOSIS — R059 Cough, unspecified: Secondary | ICD-10-CM

## 2016-01-26 MED ORDER — AMOXICILLIN-POT CLAVULANATE 875-125 MG PO TABS: 1.0000 | ORAL_TABLET | Freq: Two times a day (BID) | ORAL | 0 refills | Status: DC

## 2016-01-26 NOTE — Patient Instructions (Signed)
Try to get some rest, drink plenty of fluids, and start augmentin.  Take care.  Glad to see you.  Update me as needed.

## 2016-01-26 NOTE — Progress Notes (Signed)
Pre visit review using our clinic review tool, if applicable. No additional management support is needed unless otherwise documented below in the visit note.  The coldness in her hands improved during pregnancy, returned some in the meantime but not as bad and not in the feet.  D/w pt.  She didn't want intervention at this point.    Cough for the last few weeks, with sputum.  No fevers.  No wheeze.  No vomiting, no diarrhea, no rash.  Now with facial pain, ST- scratchy.  Some pressure in the ears.    Daughter recently with cough, she is in daycare.     Hemorrhoids flare up when her diet is worse.    Fatigue.  Unclear if from being sick, having a young child, or other causes, or some combination of all.    Meds, vitals, and allergies reviewed.   ROS: Per HPI unless specifically indicated in ROS section   GEN: nad, alert and oriented HEENT: mucous membranes moist, tm w/o erythema, nasal exam w/o erythema, clear discharge noted,  OP with cobblestoning NECK: supple w/o LA CV: rrr.   PULM: ctab, no inc wob EXT: no edema

## 2016-01-27 DIAGNOSIS — R059 Cough, unspecified: Secondary | ICD-10-CM | POA: Insufficient documentation

## 2016-01-27 DIAGNOSIS — R05 Cough: Secondary | ICD-10-CM | POA: Insufficient documentation

## 2016-01-27 NOTE — Assessment & Plan Note (Signed)
With sick exposure noted.  D/w pt.  Given duration would treat.  Lungs ctab but would still cover with augmentin.  Should be okay for breast feeding.  D/w pt.  F/u prn.  She agrees.  Nontoxic.

## 2016-04-10 ENCOUNTER — Other Ambulatory Visit: Payer: Self-pay | Admitting: Family Medicine

## 2016-04-10 DIAGNOSIS — Z862 Personal history of diseases of the blood and blood-forming organs and certain disorders involving the immune mechanism: Secondary | ICD-10-CM

## 2016-04-10 DIAGNOSIS — Z131 Encounter for screening for diabetes mellitus: Secondary | ICD-10-CM

## 2016-04-11 ENCOUNTER — Other Ambulatory Visit: Payer: 59

## 2016-04-18 ENCOUNTER — Encounter: Payer: 59 | Admitting: Family Medicine

## 2016-06-08 ENCOUNTER — Other Ambulatory Visit (INDEPENDENT_AMBULATORY_CARE_PROVIDER_SITE_OTHER): Payer: 59

## 2016-06-08 DIAGNOSIS — Z131 Encounter for screening for diabetes mellitus: Secondary | ICD-10-CM | POA: Diagnosis not present

## 2016-06-08 DIAGNOSIS — Z862 Personal history of diseases of the blood and blood-forming organs and certain disorders involving the immune mechanism: Secondary | ICD-10-CM | POA: Diagnosis not present

## 2016-06-08 LAB — CBC WITH DIFFERENTIAL/PLATELET
BASOS ABS: 0 10*3/uL (ref 0.0–0.1)
Basophils Relative: 0.5 % (ref 0.0–3.0)
Eosinophils Absolute: 0.1 10*3/uL (ref 0.0–0.7)
Eosinophils Relative: 1.4 % (ref 0.0–5.0)
HCT: 38.3 % (ref 36.0–46.0)
Hemoglobin: 13.2 g/dL (ref 12.0–15.0)
LYMPHS ABS: 1.6 10*3/uL (ref 0.7–4.0)
Lymphocytes Relative: 30.3 % (ref 12.0–46.0)
MCHC: 34.3 g/dL (ref 30.0–36.0)
MCV: 87.2 fl (ref 78.0–100.0)
MONO ABS: 0.4 10*3/uL (ref 0.1–1.0)
MONOS PCT: 6.7 % (ref 3.0–12.0)
NEUTROS ABS: 3.3 10*3/uL (ref 1.4–7.7)
NEUTROS PCT: 61.1 % (ref 43.0–77.0)
PLATELETS: 209 10*3/uL (ref 150.0–400.0)
RBC: 4.4 Mil/uL (ref 3.87–5.11)
RDW: 13.4 % (ref 11.5–15.5)
WBC: 5.3 10*3/uL (ref 4.0–10.5)

## 2016-06-08 LAB — BASIC METABOLIC PANEL
BUN: 14 mg/dL (ref 6–23)
CALCIUM: 9.1 mg/dL (ref 8.4–10.5)
CO2: 28 meq/L (ref 19–32)
CREATININE: 0.74 mg/dL (ref 0.40–1.20)
Chloride: 107 mEq/L (ref 96–112)
GFR: 95.82 mL/min (ref >60.00)
GLUCOSE: 98 mg/dL (ref 70–99)
Potassium: 4.1 mEq/L (ref 3.5–5.1)
SODIUM: 140 meq/L (ref 135–145)

## 2016-06-16 ENCOUNTER — Encounter: Payer: Self-pay | Admitting: Family Medicine

## 2016-06-16 ENCOUNTER — Ambulatory Visit (INDEPENDENT_AMBULATORY_CARE_PROVIDER_SITE_OTHER): Payer: 59 | Admitting: Family Medicine

## 2016-06-16 VITALS — BP 112/82 | HR 67 | Temp 98.3°F | Ht 71.0 in | Wt 194.0 lb

## 2016-06-16 DIAGNOSIS — Z0000 Encounter for general adult medical examination without abnormal findings: Secondary | ICD-10-CM

## 2016-06-16 DIAGNOSIS — R208 Other disturbances of skin sensation: Secondary | ICD-10-CM

## 2016-06-16 NOTE — Assessment & Plan Note (Signed)
Tetanus 2015 Flu shot done at work.   PNA and shingles not due Colon cancer screening not due, d/w pt.  DXA not due.  Pap pending per gyn clinic.   Mammogram not due yet.   Living will d/w pt. Husband designated if patient were incapacitated.   Diet and exercise d/w pt.  She isn't getting as much exercise as she would like. Taking care of herself was less of a priority since birth of her child.  D/w pt.  Patient is trying to make some adjustments.   Her daughter is in daycare and doing well.  If patient's fatigue continues, then we can check her TSH.  She'll update me as needed. See AVS.

## 2016-06-16 NOTE — Progress Notes (Signed)
Pre visit review using our clinic review tool, if applicable. No additional management support is needed unless otherwise documented below in the visit note. 

## 2016-06-16 NOTE — Progress Notes (Signed)
CPE- See plan.  Routine anticipatory guidance given to patient.  See health maintenance. Tetanus 2015 Flu shot done at work.   PNA and shingles not due Colon cancer screening not due, d/w pt.  DXA not due.  Pap pending per gyn clinic.   Mammogram not due yet.   Living will d/w pt. Husband designated if patient were incapacitated.   Diet and exercise d/w pt.  She isn't getting as much exercise as she would like. Taking care of herself was less of a priority since birth of her child.  D/w pt.  Patient is trying to make some adjustments.   Her daughter is in daycare and doing well.   She still has some coldness in her hands but less prominent than prev.  Not a constant sx.  Not bothersome enough to treat at this point.  D/w pt.  Fatigue noted.    Labs d/w pt.    PMH and SH reviewed  Meds, vitals, and allergies reviewed.   ROS: Per HPI.  Unless specifically indicated otherwise in HPI, the patient denies:  General: fever. Eyes: acute vision changes ENT: sore throat Cardiovascular: chest pain Respiratory: SOB GI: vomiting GU: dysuria Musculoskeletal: acute back pain Derm: acute rash Neuro: acute motor dysfunction Psych: worsening mood Endocrine: polydipsia Heme: bleeding Allergy: hayfever  GEN: nad, alert and oriented HEENT: mucous membranes moist NECK: supple w/o LA, no tmg CV: rrr. PULM: ctab, no inc wob ABD: soft, +bs EXT: no edema normal radial pulses B and normal cap refill B hands.

## 2016-06-16 NOTE — Assessment & Plan Note (Signed)
She still has some coldness in her hands but less prominent than prev.  Not a constant sx.  Not bothersome enough to treat at this point. She'll update me as needed.

## 2016-06-16 NOTE — Patient Instructions (Signed)
I'll await the gyn clinic notes.  If your fatigue continues then let me know so we can check your thyroid.  Take care.  Glad to see you.

## 2016-08-14 DIAGNOSIS — Z01419 Encounter for gynecological examination (general) (routine) without abnormal findings: Secondary | ICD-10-CM | POA: Diagnosis not present

## 2016-08-14 DIAGNOSIS — Z6827 Body mass index (BMI) 27.0-27.9, adult: Secondary | ICD-10-CM | POA: Diagnosis not present

## 2016-10-26 ENCOUNTER — Ambulatory Visit (INDEPENDENT_AMBULATORY_CARE_PROVIDER_SITE_OTHER): Payer: 59 | Admitting: Internal Medicine

## 2016-10-26 ENCOUNTER — Encounter: Payer: Self-pay | Admitting: Internal Medicine

## 2016-10-26 VITALS — BP 112/76 | HR 96 | Temp 98.3°F | Wt 196.0 lb

## 2016-10-26 DIAGNOSIS — J069 Acute upper respiratory infection, unspecified: Secondary | ICD-10-CM

## 2016-10-26 MED ORDER — HYDROCODONE-HOMATROPINE 5-1.5 MG/5ML PO SYRP: 5.0000 mL | ORAL_SOLUTION | Freq: Every evening | ORAL | 0 refills | Status: DC | PRN

## 2016-10-26 MED ORDER — AZITHROMYCIN 250 MG PO TABS: ORAL_TABLET | ORAL | 0 refills | Status: DC

## 2016-10-26 NOTE — Progress Notes (Signed)
HPI  Pt presents to the clinic today with c/o nasal congestion, cough and chest congestion. This started 1 week ago. She is blowing yellow mucous out of her nose. The cough is productive of yellow mucous. She has run fevers up to 100.4 but denies chills or body aches. She has tried Tylenol Cold and Sinus with minimal relief. She has no history of allergies. She has a history of asthma. She has not had sick contacts that she is aware of.  Review of Systems        Past Medical History:  Diagnosis Date  . Asthma    h/o, exercise induced.   . Blood in stool   . Frequent headaches   . History of chicken pox 1993    Family History  Problem Relation Age of Onset  . Hypertension Maternal Grandmother   . Lung cancer Maternal Grandmother   . Colon cancer Maternal Grandfather   . Breast cancer Neg Hx     Social History   Social History  . Marital status: Married    Spouse name: N/A  . Number of children: 0  . Years of education: 12   Occupational History  . MASSAGE THERAPIST [employer]   Social History Main Topics  . Smoking status: Never Smoker  . Smokeless tobacco: Never Used  . Alcohol use Yes     Comment: rare  . Drug use: No  . Sexual activity: Yes    Birth control/ protection: None   Other Topics Concern  . 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 Denies headache, fatigue, abrupt weight changes.  HEENT:  Positive nasal congestion. Denies eye redness, eye pain, pressure behind the eyes, facial pain, ear pain, ringing in the ears, wax buildup, runny nose or bloody nose. Respiratory: Positive cough. Denies difficulty breathing or shortness of breath.  Cardiovascular: Denies chest pain, chest tightness, palpitations or swelling in the  hands or feet.   No other specific complaints in a complete review of systems (except as listed in HPI above).  Objective:   BP 112/76   Pulse 96   Temp 98.3 F (36.8 C) (Oral)   Wt 196 lb (88.9 kg)   LMP 10/13/2016   SpO2 96%   BMI 27.34 kg/m   Wt Readings from Last 3 Encounters:  10/26/16 196 lb (88.9 kg)  06/16/16 194 lb (88 kg)  01/26/16 182 lb 8 oz (82.8 kg)     General: Appears her stated age, ill appearing in NAD. HEENT: Head: normal shape and size, no sinus tenderness noted;  Ears: Tm's gray and intact, normal light reflex; Nose: mucosa pink and moist, septum midline; Throat/Mouth: + PND. Teeth present, mucosa erythematous and moist, no exudate noted, no lesions or ulcerations noted.  Neck: No cervical lymphadenopathy.  Cardiovascular: Normal rate and rhythm.  Pulmonary/Chest: Normal effort and positive vesicular breath sounds. No respiratory distress. No wheezes, rales or ronchi noted.       Assessment & Plan:   Upper Respiratory Infection:  Get some rest and drink plenty of water Try Flonase OTC for nasal congestion eRx for Azithromax x 5 days Rx for Hycodan cough syrup  RTC as needed or if symptoms persist.   Webb Silversmith, NP

## 2016-10-26 NOTE — Patient Instructions (Signed)
Upper Respiratory Infection, Adult Most upper respiratory infections (URIs) are caused by a virus. A URI affects the nose, throat, and upper air passages. The most common type of URI is often called "the common cold." Follow these instructions at home:  Take medicines only as told by your doctor.  Gargle warm saltwater or take cough drops to comfort your throat as told by your doctor.  Use a warm mist humidifier or inhale steam from a shower to increase air moisture. This may make it easier to breathe.  Drink enough fluid to keep your pee (urine) clear or pale yellow.  Eat soups and other clear broths.  Have a healthy diet.  Rest as needed.  Go back to work when your fever is gone or your doctor says it is okay.  You may need to stay home longer to avoid giving your URI to others.  You can also wear a face mask and wash your hands often to prevent spread of the virus.  Use your inhaler more if you have asthma.  Do not use any tobacco products, including cigarettes, chewing tobacco, or electronic cigarettes. If you need help quitting, ask your doctor. Contact a doctor if:  You are getting worse, not better.  Your symptoms are not helped by medicine.  You have chills.  You are getting more short of breath.  You have brown or red mucus.  You have yellow or brown discharge from your nose.  You have pain in your face, especially when you bend forward.  You have a fever.  You have puffy (swollen) neck glands.  You have pain while swallowing.  You have white areas in the back of your throat. Get help right away if:  You have very bad or constant:  Headache.  Ear pain.  Pain in your forehead, behind your eyes, and over your cheekbones (sinus pain).  Chest pain.  You have long-lasting (chronic) lung disease and any of the following:  Wheezing.  Long-lasting cough.  Coughing up blood.  A change in your usual mucus.  You have a stiff neck.  You have  changes in your:  Vision.  Hearing.  Thinking.  Mood. This information is not intended to replace advice given to you by your health care provider. Make sure you discuss any questions you have with your health care provider. Document Released: 12/06/2007 Document Revised: 02/20/2016 Document Reviewed: 09/24/2013 Elsevier Interactive Patient Education  2017 Elsevier Inc.  

## 2017-06-12 ENCOUNTER — Other Ambulatory Visit: Payer: Self-pay | Admitting: Family Medicine

## 2017-06-12 DIAGNOSIS — Z131 Encounter for screening for diabetes mellitus: Secondary | ICD-10-CM

## 2017-06-12 NOTE — Progress Notes (Signed)
Only needs a fasting glucose.  Okay to do as a fingerstick if needed or preferred by patient.  Okay to do at the OV if fasting.  Thanks.   Patient notified, will come in fasting for her cpx appt

## 2017-06-14 ENCOUNTER — Other Ambulatory Visit: Payer: 59

## 2017-06-18 ENCOUNTER — Encounter: Payer: 59 | Admitting: Family Medicine

## 2017-06-19 ENCOUNTER — Encounter: Payer: Self-pay | Admitting: Family Medicine

## 2017-06-19 ENCOUNTER — Ambulatory Visit (INDEPENDENT_AMBULATORY_CARE_PROVIDER_SITE_OTHER): Payer: 59 | Admitting: Family Medicine

## 2017-06-19 VITALS — BP 122/76 | HR 72 | Temp 98.4°F | Wt 198.0 lb

## 2017-06-19 DIAGNOSIS — R059 Cough, unspecified: Secondary | ICD-10-CM

## 2017-06-19 DIAGNOSIS — Z131 Encounter for screening for diabetes mellitus: Secondary | ICD-10-CM

## 2017-06-19 DIAGNOSIS — R5383 Other fatigue: Secondary | ICD-10-CM | POA: Diagnosis not present

## 2017-06-19 DIAGNOSIS — Z0000 Encounter for general adult medical examination without abnormal findings: Secondary | ICD-10-CM

## 2017-06-19 DIAGNOSIS — R208 Other disturbances of skin sensation: Secondary | ICD-10-CM

## 2017-06-19 DIAGNOSIS — R05 Cough: Secondary | ICD-10-CM

## 2017-06-19 LAB — CBC WITH DIFFERENTIAL/PLATELET
BASOS PCT: 0.7 % (ref 0.0–3.0)
Basophils Absolute: 0 10*3/uL (ref 0.0–0.1)
EOS PCT: 1.8 % (ref 0.0–5.0)
Eosinophils Absolute: 0.1 10*3/uL (ref 0.0–0.7)
HCT: 40.3 % (ref 36.0–46.0)
Hemoglobin: 13.6 g/dL (ref 12.0–15.0)
LYMPHS ABS: 1.9 10*3/uL (ref 0.7–4.0)
LYMPHS PCT: 32.9 % (ref 12.0–46.0)
MCHC: 33.6 g/dL (ref 30.0–36.0)
MCV: 89.5 fl (ref 78.0–100.0)
Monocytes Absolute: 0.4 10*3/uL (ref 0.1–1.0)
Monocytes Relative: 6.6 % (ref 3.0–12.0)
NEUTROS PCT: 58 % (ref 43.0–77.0)
Neutro Abs: 3.4 10*3/uL (ref 1.4–7.7)
PLATELETS: 210 10*3/uL (ref 150.0–400.0)
RBC: 4.5 Mil/uL (ref 3.87–5.11)
RDW: 12.8 % (ref 11.5–15.5)
WBC: 5.8 10*3/uL (ref 4.0–10.5)

## 2017-06-19 LAB — GLUCOSE, RANDOM: Glucose, Bld: 91 mg/dL (ref 70–99)

## 2017-06-19 LAB — TSH: TSH: 10.53 u[IU]/mL — ABNORMAL HIGH (ref 0.35–4.50)

## 2017-06-19 NOTE — Patient Instructions (Addendum)
Go to the lab on the way out.  We'll contact you with your lab report. Try taking claritin 10mg  a day for about 7-10 days and see if that helps with the cough or nasal symptoms or both.  Update me as needed.  The other options is an as needed inhaler vs allergy referral.  Take care.  Glad to see you.

## 2017-06-19 NOTE — Progress Notes (Signed)
CPE- See plan.  Routine anticipatory guidance given to patient.  See health maintenance.  The possibility exists that previously documented standard health maintenance information may have been brought forward from a previous encounter into this note.  If needed, that same information has been updated to reflect the current situation based on today's encounter.    Tetanus 2015 Flu shot done at work.   PNA and shingles not due Colon cancer screening not due, d/w pt.  DXA not due.  Pap pending per gyn clinic for early 2019, d/w pt.   Mammogram not due yet. She has noted R>L breast size, changes noted prior to pregnancy but noted more now.  prev breastfed for 18 months.  She has some skin thickening on under the B breast, unclear if that is from her sports bra.  No discharge.  She still does breast exams.   Living will d/w pt. Husband designated if patient were incapacitated.   Diet and exercise d/w pt.  She isn't getting as much exercise as she would like and that is likely affecting her mood.   Her daughter is in daycare and doing well there.    Fatigue continues, had d/w pt prev.  She is getting a good amount of sleep.  Working full time.  Work is going well but she is busy with work and family demands.  Her family is doing well o/w.  No SI/HI.  She has noted stressors with a busy schedule.  Using EAP at work with counseling.    Cold hands prev noted but better than prev.  She still has some sx in her feet but not severe.    Her HA are less frequent but more notable than prev but resolved with tylenol.    Cough since 08/2016, deep to the sternum.  Noted in dusty rooms.  Eyes water, nose stuffy, then cough starts.  Doesn't happen at home, more often when out of her home.  H/o exercise induced asthma.  Some occ wheeze, no inhaler use.  Some sputum clearance and that helps.    PMH and SH reviewed  Meds, vitals, and allergies reviewed.   ROS: Per HPI.  Unless specifically indicated otherwise in  HPI, the patient denies:  General: fever. Eyes: acute vision changes ENT: sore throat Cardiovascular: chest pain Respiratory: SOB GI: vomiting GU: dysuria Musculoskeletal: acute back pain Derm: acute rash Neuro: acute motor dysfunction Psych: worsening mood Endocrine: polydipsia Heme: bleeding Allergy: hayfever  GEN: nad, alert and oriented HEENT: mucous membranes moist, TM wnl, nasal and OP exam wnl NECK: supple w/o LA, no stridor CV: rrr. PULM: ctab, no inc wob ABD: soft, +bs EXT: no edema Breast exam: No mass, nodules, tenderness, bulging, retraction, inflamation, nipple discharge or skin changes noted.  No axillary or clavicular LA.  She does have some mild skin irritation under the B breasts but this looks to be along a line of irritation from her bra/sports bra.  Chaperoned exam.  SKIN: no acute rash

## 2017-06-20 DIAGNOSIS — R5383 Other fatigue: Secondary | ICD-10-CM | POA: Insufficient documentation

## 2017-06-20 NOTE — Assessment & Plan Note (Signed)
She is putting up with symptoms, will follow clinically.  Update me as needed.  She agrees.

## 2017-06-20 NOTE — Assessment & Plan Note (Signed)
Fatigue continues, had d/w pt prev.  She is getting a good amount of sleep.  Working full time.  Work is going well but she is busy with work and family demands.  Her family is doing well o/w.  No SI/HI.  She has noted stressors with a busy schedule.  Using EAP at work with counseling.   Reasonable to check routine labs today.  See notes on labs.  Assuming her labs are normal, it would be reasonable for her to try to get a little more exercise and see if this makes a significant difference.

## 2017-06-20 NOTE — Assessment & Plan Note (Addendum)
Tetanus 2015 Flu shot done at work.   PNA and shingles not due Colon cancer screening not due, d/w pt.  DXA not due.  Pap pending per gyn clinic for early 2019, d/w pt.   Mammogram not due yet. She has noted R>L breast size, changes noted prior to pregnancy but noted more now.  prev breastfed for 18 months.  She has some skin thickening on under the B breast, appears to be from her sports bra.  No discharge.  She still does breast exams.   I do not see anything ominous on exam.  Discussed with patient.  It does not appear that she would need a mammogram at this point.  I will route this to Dr. Lynnet[facility]affe[facility] at [facility] for input and follow-up.  The patient is going to see Dr. Lynnette Caffey in the relatively near future. Living will d/w pt. Husband designated if patient were incapacitated.   Diet and exercise d/w pt.  She isn't getting as much exercise as she would like and that is likely affecting her mood.   She is considering options to try to get more exercise. Her daughter is in daycare and doing well there.

## 2017-06-20 NOTE — Assessment & Plan Note (Signed)
This sounds like an environmental trigger.  Discussed with patient about options, short trial of Claritin 10 mg a day, trial of albuterol inhaler, referral to allergy clinic.  All would be reasonable.  She wanted to try Claritin first and then update me as needed.  This is reasonable.  Lungs are clear today.

## 2017-06-21 ENCOUNTER — Other Ambulatory Visit: Payer: Self-pay | Admitting: *Deleted

## 2017-06-21 MED ORDER — LEVOTHYROXINE SODIUM 25 MCG PO TABS: 25.0000 ug | ORAL_TABLET | Freq: Every day | ORAL | 3 refills | Status: DC

## 2017-06-22 ENCOUNTER — Encounter: Payer: Self-pay | Admitting: Family Medicine

## 2017-06-22 ENCOUNTER — Other Ambulatory Visit: Payer: Self-pay | Admitting: Family Medicine

## 2017-06-22 DIAGNOSIS — E039 Hypothyroidism, unspecified: Secondary | ICD-10-CM | POA: Insufficient documentation

## 2017-08-14 ENCOUNTER — Telehealth: Payer: Self-pay | Admitting: *Deleted

## 2017-08-14 MED ORDER — OSELTAMIVIR PHOSPHATE 75 MG PO CAPS: 75.0000 mg | ORAL_CAPSULE | Freq: Every day | ORAL | 0 refills | Status: DC

## 2017-08-14 NOTE — Telephone Encounter (Signed)
Copied from [place] [id number]. Topic: Inquiry >> Aug 14, 2017  9:03 AM Pricilla Handler wrote: Reason for CRM: Patient called stating that her daughter was recently diagnosed with the flu, and she and her husband would like Dr. Damita Dunnings to place them both on Tamiflu for preventative care. Please call patient at [phone].       Thank You!!!

## 2017-08-14 NOTE — Telephone Encounter (Signed)
Patient advised.

## 2017-08-14 NOTE — Telephone Encounter (Signed)
Sent.  Start with daily dosing.  If flu sx, then change to BID dosing.  Hope they all feel better. Thanks.

## 2017-08-21 ENCOUNTER — Other Ambulatory Visit (INDEPENDENT_AMBULATORY_CARE_PROVIDER_SITE_OTHER): Payer: 59

## 2017-08-21 DIAGNOSIS — Z6827 Body mass index (BMI) 27.0-27.9, adult: Secondary | ICD-10-CM | POA: Diagnosis not present

## 2017-08-21 DIAGNOSIS — E039 Hypothyroidism, unspecified: Secondary | ICD-10-CM

## 2017-08-21 DIAGNOSIS — Z01419 Encounter for gynecological examination (general) (routine) without abnormal findings: Secondary | ICD-10-CM | POA: Diagnosis not present

## 2017-08-21 LAB — TSH: TSH: 7.31 u[IU]/mL — AB (ref 0.35–4.50)

## 2017-08-23 ENCOUNTER — Other Ambulatory Visit: Payer: Self-pay | Admitting: Family Medicine

## 2017-08-23 DIAGNOSIS — E039 Hypothyroidism, unspecified: Secondary | ICD-10-CM

## 2017-08-23 MED ORDER — LEVOTHYROXINE SODIUM 50 MCG PO TABS: 50.0000 ug | ORAL_TABLET | Freq: Every day | ORAL | 3 refills | Status: DC

## 2017-10-16 ENCOUNTER — Other Ambulatory Visit (INDEPENDENT_AMBULATORY_CARE_PROVIDER_SITE_OTHER): Payer: 59

## 2017-10-16 DIAGNOSIS — E039 Hypothyroidism, unspecified: Secondary | ICD-10-CM

## 2017-10-16 LAB — TSH: TSH: 5.8 u[IU]/mL — ABNORMAL HIGH (ref 0.35–4.50)

## 2017-10-17 ENCOUNTER — Other Ambulatory Visit: Payer: Self-pay | Admitting: Family Medicine

## 2017-10-17 DIAGNOSIS — E039 Hypothyroidism, unspecified: Secondary | ICD-10-CM

## 2017-10-17 MED ORDER — LEVOTHYROXINE SODIUM 75 MCG PO TABS: 75.0000 ug | ORAL_TABLET | Freq: Every day | ORAL | 3 refills | Status: DC

## 2017-12-06 DIAGNOSIS — H5203 Hypermetropia, bilateral: Secondary | ICD-10-CM | POA: Diagnosis not present

## 2017-12-06 DIAGNOSIS — H52221 Regular astigmatism, right eye: Secondary | ICD-10-CM | POA: Diagnosis not present

## 2017-12-18 ENCOUNTER — Other Ambulatory Visit (INDEPENDENT_AMBULATORY_CARE_PROVIDER_SITE_OTHER): Payer: 59

## 2017-12-18 DIAGNOSIS — E039 Hypothyroidism, unspecified: Secondary | ICD-10-CM | POA: Diagnosis not present

## 2017-12-18 LAB — TSH: TSH: 4.5 u[IU]/mL (ref 0.35–4.50)

## 2017-12-19 ENCOUNTER — Other Ambulatory Visit: Payer: Self-pay | Admitting: *Deleted

## 2017-12-19 MED ORDER — LEVOTHYROXINE SODIUM 75 MCG PO TABS: 75.0000 ug | ORAL_TABLET | Freq: Every day | ORAL | 3 refills | Status: DC

## 2018-05-14 DIAGNOSIS — R197 Diarrhea, unspecified: Secondary | ICD-10-CM | POA: Diagnosis not present

## 2018-05-16 DIAGNOSIS — R197 Diarrhea, unspecified: Secondary | ICD-10-CM | POA: Diagnosis not present

## 2018-06-17 ENCOUNTER — Other Ambulatory Visit: Payer: Self-pay | Admitting: Family Medicine

## 2018-06-17 DIAGNOSIS — E039 Hypothyroidism, unspecified: Secondary | ICD-10-CM

## 2018-06-17 DIAGNOSIS — Z131 Encounter for screening for diabetes mellitus: Secondary | ICD-10-CM

## 2018-06-18 ENCOUNTER — Other Ambulatory Visit (INDEPENDENT_AMBULATORY_CARE_PROVIDER_SITE_OTHER): Payer: 59

## 2018-06-18 DIAGNOSIS — Z131 Encounter for screening for diabetes mellitus: Secondary | ICD-10-CM

## 2018-06-18 DIAGNOSIS — E039 Hypothyroidism, unspecified: Secondary | ICD-10-CM

## 2018-06-18 LAB — TSH: TSH: 7.6 u[IU]/mL — ABNORMAL HIGH (ref 0.35–4.50)

## 2018-06-18 LAB — GLUCOSE, RANDOM: GLUCOSE: 90 mg/dL (ref 70–99)

## 2018-06-20 ENCOUNTER — Ambulatory Visit (INDEPENDENT_AMBULATORY_CARE_PROVIDER_SITE_OTHER): Payer: 59 | Admitting: Family Medicine

## 2018-06-20 ENCOUNTER — Encounter: Payer: Self-pay | Admitting: Family Medicine

## 2018-06-20 VITALS — BP 102/70 | HR 67 | Temp 98.2°F | Ht 71.0 in | Wt 210.5 lb

## 2018-06-20 DIAGNOSIS — Z7189 Other specified counseling: Secondary | ICD-10-CM

## 2018-06-20 DIAGNOSIS — Z0000 Encounter for general adult medical examination without abnormal findings: Secondary | ICD-10-CM

## 2018-06-20 DIAGNOSIS — E039 Hypothyroidism, unspecified: Secondary | ICD-10-CM

## 2018-06-20 DIAGNOSIS — M899 Disorder of bone, unspecified: Secondary | ICD-10-CM

## 2018-06-20 MED ORDER — LEVOTHYROXINE SODIUM 88 MCG PO TABS: 88.0000 ug | ORAL_TABLET | Freq: Every day | ORAL | 3 refills | Status: DC

## 2018-06-20 NOTE — Patient Instructions (Addendum)
New thyroid medicine dose.  Recheck TSH in about 2 months, along with vitamin D.  If you continue to have snoring and fatigue then we can send your for a sleep apnea test.  Take care.  Glad to see you.

## 2018-06-20 NOTE — Progress Notes (Signed)
CPE- See plan.  Routine anticipatory guidance given to patient.  See health maintenance.  The possibility exists that previously documented standard health maintenance information may have been brought forward from a previous encounter into this note.  If needed, that same information has been updated to reflect the current situation based on today's encounter.    Tetanus 2015 Flu shot done at work.  PNA and shingles not due Colon cancer screening not due, d/w pt.  Pap pending per gyn clinic for early 2020, d/w pt.   I will defer.  She agrees. Mammogram not due yet.  She has routine gyn f/u with Dr. Lynnette Caffey with gyn.   Living will d/w pt. Husband designated if patient were incapacitated.  Diet and exercise d/w pt. Encouraged both.   She doesn't have cold hands now.    She has been going to counseling for her mood.  She is trying to manage all of her daily demands.  She is independent and wants to manage her affairs but has trouble asking for help.  D/w pt.   reasonable to continue with counseling.  She will update me as needed.  Hypothyroidism.  TSH minimally elevated.  Compliant.  No dysphagia.  See orders.  Some fatigue.  She snores, d/w pt.    She had possible bone loss on dental xrays.  I asked her to get me a copy of the report.  We talked about getting a vit D level with f/u labs.  This has been ordered.  She was able to find out that his father died in 43 in his 55s but she doesn't have details o/w.    Daughter dx'd with likely celiac.  She had endoscopy yesterday with GI, with bx results pending.  PGM had celiac disease.    PMH and SH reviewed  Meds, vitals, and allergies reviewed.   ROS: Per HPI.  Unless specifically indicated otherwise in HPI, the patient denies:  General: fever. Eyes: acute vision changes ENT: sore throat Cardiovascular: chest pain Respiratory: SOB GI: vomiting GU: dysuria Musculoskeletal: acute back pain Derm: acute rash Neuro: acute motor  dysfunction Psych: worsening mood Endocrine: polydipsia Heme: bleeding Allergy: hayfever  GEN: nad, alert and oriented HEENT: mucous membranes moist NECK: supple w/o LA CV: rrr. PULM: ctab, no inc wob ABD: soft, +bs EXT: no edema SKIN: no acute rash

## 2018-06-23 DIAGNOSIS — Z7189 Other specified counseling: Secondary | ICD-10-CM | POA: Insufficient documentation

## 2018-06-23 NOTE — Assessment & Plan Note (Signed)
Living will d/w pt.  Husband designated if patient were incapacitated.  

## 2018-06-23 NOTE — Assessment & Plan Note (Signed)
TSH minimally elevated.  Compliant.  No dysphagia.  See orders.  Some fatigue.  She snores, d/w pt.    If she continues to have fatigue after TSH normalization with increased thyroid replacement then we can work that up at that point.  She agrees.

## 2018-06-23 NOTE — Assessment & Plan Note (Signed)
Tetanus 2015 Flu shot done at work.  PNA and shingles not due Colon cancer screening not due, d/w pt.  Pap pending per gyn clinic for early 2020, d/w pt.   I will defer.  She agrees. Mammogram not due yet.  She has routine gyn f/u with Dr. Lynnette Caffey with gyn.   Living will d/w pt. Husband designated if patient were incapacitated.  Diet and exercise d/w pt. Encouraged both.

## 2018-08-09 ENCOUNTER — Encounter: Payer: Self-pay | Admitting: Family Medicine

## 2018-08-09 ENCOUNTER — Ambulatory Visit: Payer: Self-pay | Admitting: Family Medicine

## 2018-08-09 VITALS — BP 110/75 | HR 113 | Temp 99.3°F | Resp 18 | Wt 216.6 lb

## 2018-08-09 DIAGNOSIS — R059 Cough, unspecified: Secondary | ICD-10-CM

## 2018-08-09 DIAGNOSIS — R0981 Nasal congestion: Secondary | ICD-10-CM

## 2018-08-09 DIAGNOSIS — R05 Cough: Secondary | ICD-10-CM

## 2018-08-09 DIAGNOSIS — R6889 Other general symptoms and signs: Secondary | ICD-10-CM

## 2018-08-09 MED ORDER — AZELASTINE HCL 0.1 % NA SOLN: 1.0000 | Freq: Two times a day (BID) | NASAL | 0 refills | Status: DC

## 2018-08-09 MED ORDER — OSELTAMIVIR PHOSPHATE 75 MG PO CAPS: 75.0000 mg | ORAL_CAPSULE | Freq: Two times a day (BID) | ORAL | 0 refills | Status: AC

## 2018-08-09 MED ORDER — DM-GUAIFENESIN ER 30-600 MG PO TB12: 1.0000 | ORAL_TABLET | Freq: Two times a day (BID) | ORAL | 0 refills | Status: DC | PRN

## 2018-08-09 NOTE — Progress Notes (Signed)
Zoe [name] is a 38 y.o. female who presents today with 2 days of flu like cough and congestion symptoms. She has known contact with close household members with positive diagnosis for the flu and has been acting as the primary caregiver. She initially declined prophylactic treatment 2 days ago when present with spouse and now is symptomatic. She has not used any medications over the counter for this condition up to this point.  Review of Systems  Constitutional: Positive for chills, fever and malaise/fatigue.  HENT: Positive for congestion. Negative for ear discharge, ear pain, sinus pain and sore throat.   Eyes: Negative.   Respiratory: Positive for cough and sputum production. Negative for shortness of breath.   Cardiovascular: Negative.  Negative for chest pain.  Gastrointestinal: Negative for abdominal pain, diarrhea, nausea and vomiting.  Genitourinary: Negative for dysuria, frequency, hematuria and urgency.  Musculoskeletal: Positive for myalgias.  Skin: Negative.   Neurological: Positive for headaches. Negative for dizziness.  Endo/Heme/Allergies: Negative.   Psychiatric/Behavioral: Negative.     Zoe [name] has a current medication list which includes the following prescription(s): levothyroxine. Also has No Known Allergies.  Zoe [name]  has a past medical history of Asthma, History of chicken pox (1993), and Hypothyroid. Also  has a past surgical history that includes wisdom teeth removal (11/11/13) and puncture wound repair (Left).    O: Vitals:   08/09/18 0915  BP: 110/75  Pulse: (!) 113  Resp: 18  Temp: 99.3 F (37.4 C)  SpO2: 97%     Physical Exam Vitals signs reviewed.  Constitutional:      Appearance: She is well-developed. She is not toxic-appearing.  HENT:     Head: Normocephalic.     Right Ear: Hearing, tympanic membrane, ear canal and external ear normal.     Left Ear: Hearing, tympanic membrane, ear canal and external ear normal.     Nose:     Right  Sinus: Frontal sinus tenderness present. No maxillary sinus tenderness.     Left Sinus: Frontal sinus tenderness present. No maxillary sinus tenderness.     Mouth/Throat:     Lips: Pink.     Mouth: Mucous membranes are moist.     Pharynx: Uvula midline. No pharyngeal swelling, oropharyngeal exudate, posterior oropharyngeal erythema or uvula swelling.     Tonsils: No tonsillar exudate or tonsillar abscesses. Swelling: 1+ on the right. 1+ on the left.  Eyes:     General: Lids are normal.     Conjunctiva/sclera: Conjunctivae normal.  Neck:     Musculoskeletal: Normal range of motion and neck supple.  Cardiovascular:     Rate and Rhythm: Normal rate and regular rhythm.     Pulses: Normal pulses.     Heart sounds: Normal heart sounds.  Pulmonary:     Effort: Pulmonary effort is normal.     Breath sounds: Normal breath sounds. No transmitted upper airway sounds. No decreased breath sounds, wheezing, rhonchi or rales.     Comments: Barky moist but non productive cough on exam- good air exchange lungs CTA- cough not intractable Abdominal:     General: Bowel sounds are normal.     Palpations: Abdomen is soft.  Musculoskeletal: Normal range of motion.  Lymphadenopathy:     Head:     Right side of head: Tonsillar adenopathy present. No submental or submandibular adenopathy.     Left side of head: Tonsillar adenopathy present. No submental or submandibular adenopathy.     Cervical: Cervical adenopathy present.  Right cervical: Superficial cervical adenopathy present.     Left cervical: Superficial cervical adenopathy present.  Neurological:     Mental Status: She is alert and oriented to person, place, and time.    A: 1. Cough   2. Flu-like symptoms    P: Work note x 4 days- patient works in [employer] center want to ensure that virus is done shedding before she returns. 1. Flu-like symptoms Positive flu test will treat with antiviral and supportive care- patient does not appear to be  toxic on exam and is otherwise healthy and should recover well. Discussed complications of flu and ED precautions.  - oseltamivir (TAMIFLU) 75 MG capsule; Take 1 capsule (75 mg total) by mouth 2 (two) times daily for 5 days.  2. Cough - dextromethorphan-guaiFENesin (MUCINEX DM) 30-600 MG 12hr tablet; Take 1 tablet by mouth 2 (two) times daily as needed for cough.  3. Nasal congestion - azelastine (ASTELIN) 0.1 % nasal spray; Place 1 spray into both nostrils 2 (two) times daily. Use in each nostril as directed   Discussed with patient exam findings, suspected diagnosis etiology and  reviewed recommended treatment plan and follow up, including complications and indications for urgent medical follow up and evaluation. Medications including use and indications reviewed with patient. Patient provided relevant patient education on diagnosis and/or relevant related condition that were discussed and reviewed with patient at discharge. Patient verbalized understanding of information provided and agrees with plan of care (POC), all questions answered.

## 2018-08-09 NOTE — Patient Instructions (Signed)

## 2018-08-13 ENCOUNTER — Ambulatory Visit
Admission: EM | Admit: 2018-08-13 | Discharge: 2018-08-13 | Disposition: A | Discharge status: Home / Self Care | Payer: 59 | Attending: Nurse Practitioner | Admitting: Nurse Practitioner

## 2018-08-13 ENCOUNTER — Ambulatory Visit: Payer: 59

## 2018-08-13 ENCOUNTER — Telehealth: Payer: Self-pay | Admitting: Emergency Medicine

## 2018-08-13 DIAGNOSIS — R079 Chest pain, unspecified: Secondary | ICD-10-CM | POA: Diagnosis not present

## 2018-08-13 DIAGNOSIS — R05 Cough: Secondary | ICD-10-CM | POA: Diagnosis not present

## 2018-08-13 DIAGNOSIS — J189 Pneumonia, unspecified organism: Secondary | ICD-10-CM | POA: Diagnosis not present

## 2018-08-13 MED ORDER — AZITHROMYCIN 250 MG PO TABS: 250.0000 mg | ORAL_TABLET | Freq: Every day | ORAL | 0 refills | Status: DC

## 2018-08-13 MED ORDER — AMOXICILLIN 500 MG PO CAPS: 1000.0000 mg | ORAL_CAPSULE | Freq: Three times a day (TID) | ORAL | 0 refills | Status: AC

## 2018-08-13 MED ORDER — PROMETHAZINE-DM 6.25-15 MG/5ML PO SYRP: 5.0000 mL | ORAL_SOLUTION | Freq: Four times a day (QID) | ORAL | 0 refills | Status: DC | PRN
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
## 2018-08-13 NOTE — ED Triage Notes (Signed)
Pt states dx with flu on Friday, has one more day of tamiflu. States still has a productive cough, ear and neck pain

## 2018-08-13 NOTE — Discharge Instructions (Signed)
Take medications as prescribed.  Continue your Mucinex twice daily.  Plenty of fluids.  Tylenol/ibuprofen as needed for pain/fevers.  Follow-up with your primary care doctor in 1 week.

## 2018-08-13 NOTE — Telephone Encounter (Signed)
Left message following up on visit with [facility] 

## 2018-08-15 ENCOUNTER — Ambulatory Visit: Payer: 59 | Admitting: Family Medicine

## 2018-08-20 ENCOUNTER — Other Ambulatory Visit: Payer: 59

## 2018-08-27 ENCOUNTER — Other Ambulatory Visit (INDEPENDENT_AMBULATORY_CARE_PROVIDER_SITE_OTHER): Payer: 59

## 2018-08-27 DIAGNOSIS — E039 Hypothyroidism, unspecified: Secondary | ICD-10-CM

## 2018-08-27 DIAGNOSIS — Z6829 Body mass index (BMI) 29.0-29.9, adult: Secondary | ICD-10-CM | POA: Diagnosis not present

## 2018-08-27 DIAGNOSIS — Z01419 Encounter for gynecological examination (general) (routine) without abnormal findings: Secondary | ICD-10-CM | POA: Diagnosis not present

## 2018-08-27 DIAGNOSIS — M899 Disorder of bone, unspecified: Secondary | ICD-10-CM | POA: Diagnosis not present

## 2018-08-27 LAB — TSH: TSH: 3.36 u[IU]/mL (ref 0.35–4.50)

## 2018-08-27 LAB — VITAMIN D 25 HYDROXY (VIT D DEFICIENCY, FRACTURES): VITD: 12.29 ng/mL — ABNORMAL LOW (ref 30.00–100.00)

## 2018-08-29 ENCOUNTER — Other Ambulatory Visit: Payer: Self-pay | Admitting: Family Medicine

## 2018-08-29 DIAGNOSIS — E559 Vitamin D deficiency, unspecified: Secondary | ICD-10-CM

## 2018-08-29 MED ORDER — VITAMIN D (ERGOCALCIFEROL) 1.25 MG (50000 UNIT) PO CAPS: 50000.0000 [IU] | ORAL_CAPSULE | ORAL | 0 refills | Status: DC

## 2018-09-03 DIAGNOSIS — F4323 Adjustment disorder with mixed anxiety and depressed mood: Secondary | ICD-10-CM | POA: Diagnosis not present

## 2018-09-17 ENCOUNTER — Encounter: Payer: Self-pay | Admitting: Family Medicine

## 2018-09-19 ENCOUNTER — Encounter: Payer: Self-pay | Admitting: Family Medicine

## 2018-09-19 NOTE — Progress Notes (Signed)
Letter done for patient, in EMR.

## 2018-11-20 ENCOUNTER — Other Ambulatory Visit: Payer: Self-pay

## 2018-11-20 ENCOUNTER — Other Ambulatory Visit (INDEPENDENT_AMBULATORY_CARE_PROVIDER_SITE_OTHER): Payer: 59

## 2018-11-20 DIAGNOSIS — E559 Vitamin D deficiency, unspecified: Secondary | ICD-10-CM

## 2018-11-20 LAB — VITAMIN D 25 HYDROXY (VIT D DEFICIENCY, FRACTURES): VITD: 31.06 ng/mL (ref 30.00–100.00)

## 2018-11-21 ENCOUNTER — Other Ambulatory Visit: Payer: Self-pay | Admitting: Family Medicine

## 2018-11-21 MED ORDER — VITAMIN D 50 MCG (2000 UT) PO TABS: 2000.0000 [IU] | ORAL_TABLET | Freq: Every day | ORAL | Status: DC

## 2019-03-16 NOTE — Progress Notes (Signed)
[name] [address] [place], [place] [address] Phone: [phone] Subjective:   Zoe [name], am serving as a scribe for Dr. Hulan Saas.  I'm seeing this patient by the request  of:  Tonia Ghent, MD   CC: Left shoulder pain  [id number]  Zoe [name] is a 38 y.o. female coming in with complaint of left shoulder pain. Works as Geophysicist/field seismologist. Had time off during [place] but started back with alot sessions once restrictions were lifted. Woke up in August 24th and was unable to move arm overhead. Pain in deltoid tuberosity, in her armpit and posterior rotator cuff muscles. Feels like she is loosing grip strength. Numbness in the thumb and pinky. Feels she is compensating with other muscles to get her arm overhead.       Past Medical History:  Diagnosis Date  . Asthma    h/o, exercise induced.   Marland Kitchen History of chicken pox 1993  . Hypothyroid    Past Surgical History:  Procedure Laterality Date  . puncture wound repair Left    4th grade, toy car wheel, hand injury  . wisdom teeth removal  11/11/13   Social History   Socioeconomic History  . Marital status: Married    Spouse name: Not on file  . Number of children: 0  . Years of education: 44  . Highest education level: Not on file  Occupational History  . Occupation: MASSAGE THERAPIST    Employer: [employer]  Social Needs  . Financial resource strain: Not on file  . Food insecurity    Worry: Not on file    Inability: Not on file  . Transportation needs    Medical: Not on file    Non-medical: Not on file  Tobacco Use  . Smoking status: Never Smoker  . Smokeless tobacco: Never Used  Substance and Sexual Activity  . Alcohol use: Yes    Comment: rare  . Drug use: [name]  . Sexual activity: Yes    Birth control/protection: None  Lifestyle  . Physical activity    Days per week: Not on file    Minutes per session: Not on file  . Stress: Not on file  Relationships  .  Social Herbalist on phone: Not on file    Gets together: Not on file    Attends religious service: Not on file    Active member of club or organization: Not on file    Attends meetings of clubs or organizations: Not on file    Relationship status: Not on file  Other Topics Concern  . 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     Speaks [place], [place], and [place]   Enjoys basketball   [name] Known Allergies Family History  Problem Relation Age of Onset  . Hypertension Maternal Grandmother   . Lung cancer Maternal Grandmother   . Colon cancer Maternal Grandfather   . Breast cancer Neg Hx     Current Outpatient Medications (Endocrine & Metabolic):  .  levothyroxine (SYNTHROID, LEVOTHROID) 88 MCG tablet, Take 1 tablet (88 mcg total) by mouth daily before breakfast.   Current Outpatient Medications (Respiratory):  .  azelastine (ASTELIN) 0.1 % nasal spray, Place 1 spray into both nostrils 2 (two) times daily. Use in each nostril as directed .  dextromethorphan-guaiFENesin [facility]  DM) 30-600 MG 12hr tablet, Take 1 tablet by mouth 2 (two) times daily as needed for cough. .  promethazine-dextromethorphan (PROMETHAZINE-DM) 6.25-15 MG/5ML syrup, Take 5 mLs by mouth 4 (four) times daily as needed for cough.    Current Outpatient Medications (Other):  .  azithromycin (ZITHROMAX) 250 MG tablet, Take 1 tablet (250 mg total) by mouth daily. Take first 2 tablets together, then 1 every day until finished. .  Cholecalciferol (VITAMIN D) 50 MCG (2000 UT) tablet, Take 1 tablet (2,000 Units total) by mouth daily.    Past medical history, social, surgical and family history all reviewed in electronic medical record.  [name] pertanent information unless stated regarding to the chief complaint.   Review of Systems:  [name] headache, visual changes, nausea, vomiting, diarrhea,  constipation, dizziness, abdominal pain, skin rash, fevers, chills, night sweats, weight loss, swollen lymph nodes, body aches, joint swelling,  chest pain, shortness of breath, mood changes.  Positive muscle aches  Objective  currently breastfeeding.    General: [name] apparent distress alert and oriented x3 mood and affect normal, dressed appropriately.  HEENT: Pupils equal, extraocular movements intact  Respiratory: Patient's speak in full sentences and does not appear short of breath  Cardiovascular: [name] lower extremity edema, non tender, [name] erythema  Skin: Warm dry intact with [name] signs of infection or rash on extremities or on axial skeleton.  Abdomen: Soft nontender  Neuro: Cranial nerves II through XII are intact, neurovascularly intact in all extremities with 2+ DTRs and 2+ pulses.  Lymph: [name] lymphadenopathy of posterior or anterior cervical chain or axillae bilaterally.  Gait normal with good balance and coordination.  MSK:  Non tender with full range of motion and good stability and symmetric strength and tone of  elbows, wrist, hip, knee and ankles bilaterally.  Shoulder: left Inspection reveals [name] abnormalities, atrophy or asymmetry. Palpation is normal with [name] tenderness over AC joint or bicipital groove. ROM is full in all planes passively. Rotator cuff strength normal throughout. signs of impingement with positive Neer and Hawkin's tests, but negative empty can sign. Speeds and Yergason's tests normal. [name] labral pathology noted with negative Obrien's, negative clunk and good stability. Normal scapular function observed. [name] painful arc and [name] drop arm sign. [name] apprehension sign  MSK US performed of: left This study was ordered, performed, and interpreted by Charlann Boxer D.O.  Shoulder:   Supraspinatus:  Appears normal on long and transverse views, patient has what appears to be more of a thickening of the tendon as well as the anterior capsule that is consistent with chronic  tendinitis and potential frozen shoulder bursal bulge seen with shoulder abduction on impingement view. Subscapularis:  Appears normal on long and transverse views. Positive bursa Teres Minor:  Appears normal on long and transverse views. AC joint:  Capsule undistended, [name] geyser sign. Glenohumeral Joint:  Appears normal without effusion. Glenoid Labrum:  Intact without visualized tears. Biceps Tendon:  Appears normal on long and transverse views, [name] fraying of tendon, tendon located in intertubercular groove, [name] subluxation with shoulder internal or external rotation.  Impression: Subacromial bursitis with possible severe tendinitis and early frozen shoulder  97110; 15 additional minutes spent for Therapeutic exercises as stated in above notes.  This included exercises focusing on stretching, strengthening, with significant focus on eccentric aspects.   Long term goals include an improvement in range of motion, strength, endurance as well as avoiding reinjury. Patient's frequency would include in 1-2 times a day, 3-5 times a week for  a duration of 6-12 weeks. Shoulder Exercises that included:  Basic scapular stabilization to include adduction and depression of scapula Scaption, focusing on proper movement and good control Internal and External rotation utilizing a theraband, with elbow tucked at side entire time Rows with theraband which was given today by [place]  Proper technique shown and discussed handout in great detail with ATC.  All questions were discussed and answered.   Impression: Technically successful ultrasound guided injection.   Impression and Recommendations:     This case required medical decision making of moderate complexity. The above documentation has been reviewed and is accurate and complete Lyndal Pulley, DO       Note: This dictation was prepared with Dragon dictation along with smaller phrase technology. Any transcriptional errors that result from this process are  unintentional.

## 2019-03-18 ENCOUNTER — Encounter: Payer: Self-pay | Admitting: Family Medicine

## 2019-03-18 ENCOUNTER — Ambulatory Visit (INDEPENDENT_AMBULATORY_CARE_PROVIDER_SITE_OTHER): Payer: 59 | Admitting: Family Medicine

## 2019-03-18 ENCOUNTER — Ambulatory Visit: Payer: Self-pay

## 2019-03-18 VITALS — BP 122/82 | HR 71 | Ht 71.0 in | Wt 205.0 lb

## 2019-03-18 DIAGNOSIS — G8929 Other chronic pain: Secondary | ICD-10-CM

## 2019-03-18 DIAGNOSIS — M7582 Other shoulder lesions, left shoulder: Secondary | ICD-10-CM | POA: Diagnosis not present

## 2019-03-18 DIAGNOSIS — M25512 Pain in left shoulder: Secondary | ICD-10-CM

## 2019-03-18 MED ORDER — NITROGLYCERIN 0.2 MG/HR TD PT24: MEDICATED_PATCH | TRANSDERMAL | 1 refills | Status: DC

## 2019-03-18 MED ORDER — DICLOFENAC SODIUM 2 % TD SOLN: 2.0000 g | Freq: Two times a day (BID) | TRANSDERMAL | 3 refills | Status: DC

## 2019-03-18 NOTE — Assessment & Plan Note (Signed)
I believe the patient does have more of a chronic tendinitis with some subacromial bursitis that is contributing to a lot of the discomfort and pain and impingement-like.  Patient does have some mild increase in limited range of motion somewhat.  We will monitor closely.  Discussed icing regimen and home exercises, discussed which activities to do which was to avoid.  Follow-up again in 4 to 8 weeks worsening symptoms consider injection and formal physical therapy

## 2019-03-18 NOTE — Patient Instructions (Addendum)
Good to see you.  Ice 20 minutes 2 times daily. Usually after activity and before bed. Exercises 3 times a week.  pennsaid pinkie amount topically 2 times daily as needed.  Vitamin D 2000 IU daily  Nitroglycerin Protocol   Apply 1/4 nitroglycerin patch to affected area daily.  Change position of patch within the affected area every 24 hours.  You may experience a headache during the first 1-2 weeks of using the patch, these should subside.  If you experience headaches after beginning nitroglycerin patch treatment, you may take your preferred over the counter pain reliever.  Another side effect of the nitroglycerin patch is skin irritation or rash related to patch adhesive.  Please notify our office if you develop more severe headaches or rash, and stop the patch.  Tendon healing with nitroglycerin patch may require 12 to 24 weeks depending on the extent of injury.  Men should not use if taking Viagra, Cialis, or Levitra.   Do not use if you have migraines or rosacea.   See me again in 4-6 weeks

## 2019-04-13 NOTE — Progress Notes (Signed)
[name] [place] [address] [place], [place] [address] Phone: [phone] Subjective:   I Zoe [name] am serving as a Education administrator for Dr. Hulan Saas.    CC: Shoulder pain follow-up  [id number]   03/18/2019 I believe the patient does have more of a chronic tendinitis with some subacromial bursitis that is contributing to a lot of the discomfort and pain and impingement-like.  Patient does have some mild increase in limited range of motion somewhat.  We will monitor closely.  Discussed icing regimen and home exercises, discussed which activities to do which was to avoid.  Follow-up again in 4 to 8 weeks worsening symptoms consider injection and formal physical therapy  04/15/2019 Zoe [name] is a 38 y.o. female coming in with complaint of left shoulder pain. States that she is doing better. External rotation is painful as well as at 90 degrees.  Patient continues to have some difficulty but is made some improvement.  States about 80% better.  Patient still noticed now though having some mild lack of range of motion in certain planes     Past Medical History:  Diagnosis Date  . Asthma    h/o, exercise induced.   Marland Kitchen History of chicken pox 1993  . Hypothyroid    Past Surgical History:  Procedure Laterality Date  . puncture wound repair Left    4th grade, toy car wheel, hand injury  . wisdom teeth removal  11/11/13   Social History   Socioeconomic History  . Marital status: Married    Spouse name: Not on file  . Number of children: 0  . Years of education: 42  . Highest education level: Not on file  Occupational History  . Occupation: Zoe [name]    Employer: [employer]  Social Needs  . Financial resource strain: Not on file  . Food insecurity    Worry: Not on file    Inability: Not on file  . Transportation needs    Medical: Not on file    Non-medical: Not on file  Tobacco Use  . Smoking status: Never Smoker  . Smokeless  tobacco: Never Used  Substance and Sexual Activity  . Alcohol use: Yes    Comment: rare  . Drug use: No  . Sexual activity: Yes    Birth control/protection: None  Lifestyle  . Physical activity    Days per week: Not on file    Minutes per session: Not on file  . Stress: Not on file  Relationships  . Social Herbalist on phone: Not on file    Gets together: Not on file    Attends religious service: Not on file    Active member of club or organization: Not on file    Attends meetings of clubs or organizations: Not on file    Relationship status: Not on file  Other Topics Concern  . Not on file  Social History Narrative   From [place], to [place]   Married 2009   Daughter Zoe [name], born 03/2015   Zoe [name] with [employer].     Speaks [place], [place], and [place]   Enjoys basketball   No Known Allergies Family History  Problem Relation Age of Onset  . Hypertension Maternal Grandmother   . Lung cancer Maternal Grandmother   . Colon cancer Maternal Grandfather   . Breast cancer Neg Hx     Current Outpatient Medications (Endocrine & Metabolic):  .  levothyroxine (SYNTHROID, LEVOTHROID) 88  MCG tablet, Take 1 tablet (88 mcg total) by mouth daily before breakfast.  Current Outpatient Medications (Cardiovascular):  .  nitroGLYCERIN (NITRODUR - DOSED IN MG/24 HR) 0.2 mg/hr patch, 1/4 patch daily  Current Outpatient Medications (Respiratory):  .  azelastine (ASTELIN) 0.1 % nasal spray, Place 1 spray into both nostrils 2 (two) times daily. Use in each nostril as directed .  dextromethorphan-guaiFENesin (MUCINEX DM) 30-600 MG 12hr tablet, Take 1 tablet by mouth 2 (two) times daily as needed for cough. .  promethazine-dextromethorphan (PROMETHAZINE-DM) 6.25-15 MG/5ML syrup, Take 5 mLs by mouth 4 (four) times daily as needed for cough.    Current Outpatient Medications (Other):  .  azithromycin (ZITHROMAX) 250 MG tablet, Take 1 tablet (250 mg total) by mouth daily.  Take first 2 tablets together, then 1 every day until finished. .  Cholecalciferol (VITAMIN D) 50 MCG (2000 UT) tablet, Take 1 tablet (2,000 Units total) by mouth daily. .  Diclofenac Sodium 2 % SOLN, Place 2 g onto the skin 2 (two) times daily.    Past medical history, social, surgical and family history all reviewed in electronic medical record.  No pertanent information unless stated regarding to the chief complaint.   Review of Systems:  No headache, visual changes, nausea, vomiting, diarrhea, constipation, dizziness, abdominal pain, skin rash, fevers, chills, night sweats, weight loss, swollen lymph nodes, body aches, joint swelling,  chest pain, shortness of breath, mood changes.  Positive muscle aches  Objective  Blood pressure 90/80, pulse 79, height 5\' 11"  (1.803 m), weight 203 lb (92.1 kg), SpO2 98 %, currently breastfeeding.    General: No apparent distress alert and oriented x3 mood and affect normal, dressed appropriately.  HEENT: Pupils equal, extraocular movements intact  Respiratory: Patient's speak in full sentences and does not appear short of breath  Cardiovascular: No lower extremity edema, non tender, no erythema  Skin: Warm dry intact with no signs of infection or rash on extremities or on axial skeleton.  Abdomen: Soft nontender  Neuro: Cranial nerves II through XII are intact, neurovascularly intact in all extremities with 2+ DTRs and 2+ pulses.  Lymph: No lymphadenopathy of posterior or anterior cervical chain or axillae bilaterally.  Gait normal with good balance and coordination.  MSK:  Non tender with full range of motion and good stability and symmetric strength and tone of  elbows, wrist, hip, knee and ankles bilaterally.  Shoulder: Left Inspection reveals no abnormalities, atrophy or asymmetry. Palpation is normal with no tenderness over AC joint or bicipital groove. ROM mild decrease in external range of motion on the left side. Rotator cuff strength  normal throughout. Very mild impingement Speeds and Yergason's tests normal. No labral pathology noted with negative Obrien's, negative clunk and good stability. Normal scapular function observed. No painful arc and no drop arm sign. No apprehension sign Contralateral shoulder unremarkable      Impression and Recommendations:      The above documentation has been reviewed and is accurate and complete Zoe Pulley, DO       Note: This dictation was prepared with Dragon dictation along with smaller phrase technology. Any transcriptional errors that result from this process are unintentional.

## 2019-04-15 ENCOUNTER — Other Ambulatory Visit: Payer: Self-pay

## 2019-04-15 ENCOUNTER — Encounter: Payer: Self-pay | Admitting: Family Medicine

## 2019-04-15 ENCOUNTER — Ambulatory Visit (INDEPENDENT_AMBULATORY_CARE_PROVIDER_SITE_OTHER): Payer: 59 | Admitting: Family Medicine

## 2019-04-15 DIAGNOSIS — M7582 Other shoulder lesions, left shoulder: Secondary | ICD-10-CM | POA: Diagnosis not present

## 2019-04-15 NOTE — Assessment & Plan Note (Signed)
Patient does have more of a tendinitis.  Discussed with patient about icing regimen and home exercise, discussed topical anti-inflammatories.  Is having signs and symptoms that are more consistent with possible frozen shoulder.  Patient wants to continue with conservative therapy.  Follow-up again in 5 to 6 weeks if no improvement consider formal physical therapy and injection.

## 2019-04-15 NOTE — Patient Instructions (Addendum)
Continue with exercises See me in 5-6 weeks

## 2019-05-01 DIAGNOSIS — M25512 Pain in left shoulder: Secondary | ICD-10-CM | POA: Diagnosis not present

## 2019-05-08 DIAGNOSIS — M25512 Pain in left shoulder: Secondary | ICD-10-CM | POA: Diagnosis not present

## 2019-05-20 ENCOUNTER — Ambulatory Visit (INDEPENDENT_AMBULATORY_CARE_PROVIDER_SITE_OTHER): Payer: 59 | Admitting: Family Medicine

## 2019-05-20 ENCOUNTER — Encounter: Payer: Self-pay | Admitting: Family Medicine

## 2019-05-20 DIAGNOSIS — M7582 Other shoulder lesions, left shoulder: Secondary | ICD-10-CM

## 2019-05-20 NOTE — Assessment & Plan Note (Signed)
Patient is doing remarkably well at this time.  Discussed icing regimen and home exercise, discussed which activities to do which wants to avoid.  Patient should increase activity slowly over the course the next several weeks.  As long as patient continues to do well can follow-up as needed

## 2019-05-20 NOTE — Progress Notes (Signed)
[name] [address] [place], [place] [address] Phone: [phone] Subjective:   Zoe [name], am serving as a scribe for Dr. Hulan Saas.   CC: Left shoulder pain follow-up  [id number]   03/16/2019 Patient does have more of a tendinitis.  Discussed with patient about icing regimen and home exercise, discussed topical anti-inflammatories.  Is having signs and symptoms that are more consistent with possible frozen shoulder.  Patient wants to continue with conservative therapy.  Follow-up again in 5 to 6 weeks if [name] improvement consider formal physical therapy and injection.  Update 05/20/2019 Zoe [name] is a 38 y.o. female coming in with complaint of left shoulder pain. Patient states that her shoulder is doing much better. Noticing pain over left rhomboid. Otherwise she has been trying to be mindful of her posture during the day.    Patient would state that he is feeling approximately 95 to 100% better at this moment.  Past Medical History:  Diagnosis Date  . Asthma    h/o, exercise induced.   Marland Kitchen History of chicken pox 1993  . Hypothyroid    Past Surgical History:  Procedure Laterality Date  . puncture wound repair Left    4th grade, toy car wheel, hand injury  . wisdom teeth removal  11/11/13   Social History   Socioeconomic History  . Marital status: Married    Spouse name: Not on file  . Number of children: 0  . Years of education: 70  . Highest education level: Not on file  Occupational History  . Occupation: MASSAGE THERAPIST    Employer: [employer]  Social Needs  . Financial resource strain: Not on file  . Food insecurity    Worry: Not on file    Inability: Not on file  . Transportation needs    Medical: Not on file    Non-medical: Not on file  Tobacco Use  . Smoking status: Never Smoker  . Smokeless tobacco: Never Used  Substance and Sexual Activity  . Alcohol use: Yes    Comment: rare  . Drug use: [name]  .  Sexual activity: Yes    Birth control/protection: None  Lifestyle  . Physical activity    Days per week: Not on file    Minutes per session: Not on file  . Stress: Not on file  Relationships  . Social Herbalist on phone: Not on file    Gets together: Not on file    Attends religious service: Not on file    Active member of club or organization: Not on file    Attends meetings of clubs or organizations: Not on file    Relationship status: Not on file  Other Topics Concern  . 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     Speaks [place], [place], and [place]   Enjoys basketball   [name] Known Allergies Family History  Problem Relation Age of Onset  . Hypertension Maternal Grandmother   . Lung cancer Maternal Grandmother   . Colon cancer Maternal Grandfather   . Breast cancer Neg Hx     Current Outpatient Medications (Endocrine & Metabolic):  .  levothyroxine (SYNTHROID, LEVOTHROID) 88 MCG tablet, Take 1 tablet (88 mcg total) by mouth daily before breakfast.  Current Outpatient Medications (Cardiovascular):  .  nitroGLYCERIN (NITRODUR - DOSED  IN MG/24 HR) 0.2 mg/hr patch, 1/4 patch daily  Current Outpatient Medications (Respiratory):  .  azelastine (ASTELIN) 0.1 % nasal spray, Place 1 spray into both nostrils 2 (two) times daily. Use in each nostril as directed .  dextromethorphan-guaiFENesin (MUCINEX DM) 30-600 MG 12hr tablet, Take 1 tablet by mouth 2 (two) times daily as needed for cough. .  promethazine-dextromethorphan (PROMETHAZINE-DM) 6.25-15 MG/5ML syrup, Take 5 mLs by mouth 4 (four) times daily as needed for cough.    Current Outpatient Medications (Other):  .  azithromycin (ZITHROMAX) 250 MG tablet, Take 1 tablet (250 mg total) by mouth daily. Take first 2 tablets together, then 1 every day until finished. .  Cholecalciferol (VITAMIN D) 50 MCG (2000 UT)  tablet, Take 1 tablet (2,000 Units total) by mouth daily. .  Diclofenac Sodium 2 % SOLN, Place 2 g onto the skin 2 (two) times daily.    Past medical history, social, surgical and family history all reviewed in electronic medical record.  [name] pertanent information unless stated regarding to the chief complaint.   Review of Systems:  [name] headache, visual changes, nausea, vomiting, diarrhea, constipation, dizziness, abdominal pain, skin rash, fevers, chills, night sweats, weight loss, swollen lymph nodes, body aches, joint swelling, muscle aches, chest pain, shortness of breath, mood changes.   Objective  Blood pressure 110/64, pulse 72, height 5\' 11"  (1.803 m), weight 203 lb (92.1 kg), SpO2 98 %, currently breastfeeding.    General: [name] apparent distress alert and oriented x3 mood and affect normal, dressed appropriately.  HEENT: Pupils equal, extraocular movements intact  Respiratory: Patient's speak in full sentences and does not appear short of breath  Cardiovascular: [name] lower extremity edema, non tender, [name] erythema  Skin: Warm dry intact with [name] signs of infection or rash on extremities or on axial skeleton.  Abdomen: Soft nontender  Neuro: Cranial nerves II through XII are intact, neurovascularly intact in all extremities with 2+ DTRs and 2+ pulses.  Lymph: [name] lymphadenopathy of posterior or anterior cervical chain or axillae bilaterally.  Gait normal with good balance and coordination.  MSK:  Non tender with full range of motion and good stability and symmetric strength and tone of  elbows, wrist, hip, knee and ankles bilaterally.  Shoulder: Left Inspection reveals [name] abnormalities, atrophy or asymmetry. Palpation is normal with [name] tenderness over AC joint or bicipital groove. ROM is full in all planes. Rotator cuff strength normal throughout. [name] signs of impingement with negative Neer and Hawkin's tests, empty can sign. Speeds and Yergason's tests normal. [name] labral pathology noted  with negative Obrien's, negative clunk and good stability. Normal scapular function observed. [name] painful arc and [name] drop arm sign. [name] apprehension sign Contralateral shoulder unremarkable    Impression and Recommendations:     . The above documentation has been reviewed and is accurate and complete Lyndal Pulley, DO       Note: This dictation was prepared with Dragon dictation along with smaller phrase technology. Any transcriptional errors that result from this process are unintentional.

## 2019-05-22 DIAGNOSIS — M25512 Pain in left shoulder: Secondary | ICD-10-CM | POA: Diagnosis not present

## 2019-06-20 ENCOUNTER — Telehealth: Payer: Self-pay

## 2019-06-20 NOTE — Telephone Encounter (Signed)
LVM w COVID scree, front door and back lab info 12.18.2020 TLJ

## 2019-06-22 ENCOUNTER — Other Ambulatory Visit: Payer: Self-pay | Admitting: Family Medicine

## 2019-06-22 DIAGNOSIS — Z131 Encounter for screening for diabetes mellitus: Secondary | ICD-10-CM

## 2019-06-22 DIAGNOSIS — E039 Hypothyroidism, unspecified: Secondary | ICD-10-CM

## 2019-06-22 DIAGNOSIS — E559 Vitamin D deficiency, unspecified: Secondary | ICD-10-CM

## 2019-06-24 ENCOUNTER — Other Ambulatory Visit: Payer: Self-pay

## 2019-06-24 ENCOUNTER — Other Ambulatory Visit (INDEPENDENT_AMBULATORY_CARE_PROVIDER_SITE_OTHER): Payer: 59

## 2019-06-24 DIAGNOSIS — E559 Vitamin D deficiency, unspecified: Secondary | ICD-10-CM

## 2019-06-24 DIAGNOSIS — E039 Hypothyroidism, unspecified: Secondary | ICD-10-CM

## 2019-06-24 DIAGNOSIS — Z131 Encounter for screening for diabetes mellitus: Secondary | ICD-10-CM

## 2019-06-24 LAB — GLUCOSE, RANDOM: Glucose, Bld: 104 mg/dL — ABNORMAL HIGH (ref 70–99)

## 2019-06-24 LAB — TSH: TSH: 8.93 u[IU]/mL — ABNORMAL HIGH (ref 0.35–4.50)

## 2019-06-24 LAB — VITAMIN D 25 HYDROXY (VIT D DEFICIENCY, FRACTURES): VITD: 22.07 ng/mL — ABNORMAL LOW (ref 30.00–100.00)

## 2019-07-01 ENCOUNTER — Ambulatory Visit (INDEPENDENT_AMBULATORY_CARE_PROVIDER_SITE_OTHER): Payer: 59 | Admitting: Family Medicine

## 2019-07-01 ENCOUNTER — Encounter: Payer: Self-pay | Admitting: Family Medicine

## 2019-07-01 ENCOUNTER — Other Ambulatory Visit: Payer: Self-pay

## 2019-07-01 VITALS — BP 122/82 | HR 68 | Temp 97.3°F | Ht 71.0 in | Wt 202.3 lb

## 2019-07-01 DIAGNOSIS — E559 Vitamin D deficiency, unspecified: Secondary | ICD-10-CM

## 2019-07-01 DIAGNOSIS — Z0000 Encounter for general adult medical examination without abnormal findings: Secondary | ICD-10-CM

## 2019-07-01 DIAGNOSIS — E039 Hypothyroidism, unspecified: Secondary | ICD-10-CM

## 2019-07-01 DIAGNOSIS — Z7189 Other specified counseling: Secondary | ICD-10-CM

## 2019-07-01 MED ORDER — VITAMIN D 50 MCG (2000 UT) PO TABS: 4000.0000 [IU] | ORAL_TABLET | Freq: Every day | ORAL | Status: AC

## 2019-07-01 NOTE — Patient Instructions (Signed)
4000 IU vit D a day for now (2x2000) and recheck labs in about 3 months.  If you notice changes in the meantime, then let me know.  Take care.  Glad to see you.

## 2019-07-01 NOTE — Progress Notes (Signed)
This visit occurred during the SARS-CoV-2 public health emergency.  Safety protocols were in place, including screening questions prior to the visit, additional usage of staff PPE, and extensive cleaning of exam room while observing appropriate contact time as indicated for disinfecting solutions.   CPE- See plan.  Routine anticipatory guidance given to patient.  See health maintenance.  The possibility exists that previously documented standard health maintenance information may have been brought forward from a previous encounter into this note.  If needed, that same information has been updated to reflect the current situation based on today's encounter.    Tetanus 2015 Flu shot 2020.   PNA and shingles not due  Colon cancer screening not due, d/w pt.  Pap pending per gyn clinic for early 2020, d/w pt.  I will defer.  She agrees. Mammogram not due yet.  She had routine gyn f/u with Dr. Lynnette Caffey with gyn, done 08/27/2018 Living will d/w pt. Husband designated if patient were incapacitated.  Diet and exercise d/w pt. Encouraged both.   She had chills then fever 102, tachycardia after flu shot.  Sx resolved after about 1 day.  covid neg at the time.  Discussed with patient.  She likely had a vigorous immune response to flu vaccination.  Vit D low.  On replacement at baseline.  D/w pt.    TSH up, d/w pt.  She stopped thyroid replacement.  TSH up.  She cut out meat intake.  She has been working on diet in the meantime.  She has prev weight gain then weight loss with diet changes.  She is sleeping well.  Her energy is good.  She feels well.  No neck mass, no dysphagia.    Her shoulder is clearly better.  She has full L shoudler ROM.    PMH and SH reviewed  Meds, vitals, and allergies reviewed.   ROS: Per HPI.  Unless specifically indicated otherwise in HPI, the patient denies:  General: fever. Eyes: acute vision changes ENT: sore throat Cardiovascular: chest pain Respiratory: SOB GI:  vomiting GU: dysuria Musculoskeletal: acute back pain Derm: acute rash Neuro: acute motor dysfunction Psych: worsening mood Endocrine: polydipsia Heme: bleeding Allergy: hayfever  GEN: nad, alert and oriented HEENT: ncat NECK: supple w/o LA CV: rrr. PULM: ctab, no inc wob ABD: soft, +bs EXT: no edema SKIN: no acute rash

## 2019-07-02 DIAGNOSIS — E559 Vitamin D deficiency, unspecified: Secondary | ICD-10-CM | POA: Insufficient documentation

## 2019-07-02 NOTE — Assessment & Plan Note (Signed)
She stopped replacement in the meantime.  Discussed options.  She feels well.  No thyromegaly on exam.  Recheck in 3 months.  If her TSH is progressively abnormal she is likely going to need to restart replacement.  She will update me as needed in the meantime.

## 2019-07-02 NOTE — Assessment & Plan Note (Signed)
Tetanus 2015 Flu shot 2020.   PNA and shingles not due  Colon cancer screening not due, d/w pt.  Pap pending per gyn clinic for early 2020, d/w pt.  I will defer.  She agrees. Mammogram not due yet.  She had routine gyn f/u with Dr. Lynnette Caffey with gyn, done 08/27/2018 Living will d/w pt. Husband designated if patient were incapacitated.  Diet and exercise d/w pt. Encouraged both.

## 2019-07-02 NOTE — Assessment & Plan Note (Signed)
Increase replacement to 4000 units daily and recheck in a few months.  Discussed with patient.  She agrees.

## 2019-07-02 NOTE — Assessment & Plan Note (Signed)
Living will d/w pt.  Husband designated if patient were incapacitated.  

## 2019-07-08 DIAGNOSIS — M25512 Pain in left shoulder: Secondary | ICD-10-CM | POA: Diagnosis not present

## 2019-08-19 DIAGNOSIS — M25512 Pain in left shoulder: Secondary | ICD-10-CM | POA: Diagnosis not present

## 2019-09-30 ENCOUNTER — Other Ambulatory Visit: Payer: Self-pay

## 2019-09-30 ENCOUNTER — Other Ambulatory Visit (INDEPENDENT_AMBULATORY_CARE_PROVIDER_SITE_OTHER): Payer: 59

## 2019-09-30 DIAGNOSIS — E559 Vitamin D deficiency, unspecified: Secondary | ICD-10-CM | POA: Diagnosis not present

## 2019-09-30 DIAGNOSIS — E039 Hypothyroidism, unspecified: Secondary | ICD-10-CM | POA: Diagnosis not present

## 2019-09-30 LAB — TSH: TSH: 10.49 u[IU]/mL — ABNORMAL HIGH (ref 0.35–4.50)

## 2019-09-30 LAB — VITAMIN D 25 HYDROXY (VIT D DEFICIENCY, FRACTURES): VITD: 27.04 ng/mL — ABNORMAL LOW (ref 30.00–100.00)

## 2019-10-03 ENCOUNTER — Other Ambulatory Visit: Payer: Self-pay | Admitting: Family Medicine

## 2019-10-03 DIAGNOSIS — E039 Hypothyroidism, unspecified: Secondary | ICD-10-CM

## 2019-10-03 DIAGNOSIS — E559 Vitamin D deficiency, unspecified: Secondary | ICD-10-CM

## 2019-10-03 MED ORDER — LEVOTHYROXINE SODIUM 25 MCG PO TABS: 25.0000 ug | ORAL_TABLET | Freq: Every day | ORAL | Status: DC

## 2019-10-09 DIAGNOSIS — Z01419 Encounter for gynecological examination (general) (routine) without abnormal findings: Secondary | ICD-10-CM | POA: Diagnosis not present

## 2019-10-09 DIAGNOSIS — Z6827 Body mass index (BMI) 27.0-27.9, adult: Secondary | ICD-10-CM | POA: Diagnosis not present

## 2019-10-17 DIAGNOSIS — Z1283 Encounter for screening for malignant neoplasm of skin: Secondary | ICD-10-CM | POA: Diagnosis not present

## 2019-10-17 DIAGNOSIS — D225 Melanocytic nevi of trunk: Secondary | ICD-10-CM | POA: Diagnosis not present

## 2019-10-17 DIAGNOSIS — D485 Neoplasm of uncertain behavior of skin: Secondary | ICD-10-CM | POA: Diagnosis not present

## 2019-10-23 DIAGNOSIS — M25512 Pain in left shoulder: Secondary | ICD-10-CM | POA: Diagnosis not present

## 2019-12-04 DIAGNOSIS — M25512 Pain in left shoulder: Secondary | ICD-10-CM | POA: Diagnosis not present

## 2019-12-31 ENCOUNTER — Other Ambulatory Visit: Payer: Self-pay | Admitting: Family Medicine

## 2019-12-31 ENCOUNTER — Encounter: Payer: Self-pay | Admitting: Family Medicine

## 2019-12-31 ENCOUNTER — Other Ambulatory Visit (INDEPENDENT_AMBULATORY_CARE_PROVIDER_SITE_OTHER): Payer: 59

## 2019-12-31 DIAGNOSIS — E039 Hypothyroidism, unspecified: Secondary | ICD-10-CM

## 2019-12-31 DIAGNOSIS — E559 Vitamin D deficiency, unspecified: Secondary | ICD-10-CM | POA: Diagnosis not present

## 2019-12-31 LAB — TSH: TSH: 6.97 u[IU]/mL — ABNORMAL HIGH (ref 0.35–4.50)

## 2019-12-31 LAB — VITAMIN D 25 HYDROXY (VIT D DEFICIENCY, FRACTURES): VITD: 29.01 ng/mL — ABNORMAL LOW (ref 30.00–100.00)

## 2019-12-31 MED ORDER — LEVOTHYROXINE SODIUM 50 MCG PO TABS: 50.0000 ug | ORAL_TABLET | Freq: Every day | ORAL | 3 refills | Status: DC

## 2020-01-05 ENCOUNTER — Other Ambulatory Visit: Payer: Self-pay | Admitting: Family Medicine

## 2020-01-15 DIAGNOSIS — M25512 Pain in left shoulder: Secondary | ICD-10-CM | POA: Diagnosis not present

## 2020-02-19 DIAGNOSIS — H5203 Hypermetropia, bilateral: Secondary | ICD-10-CM | POA: Diagnosis not present

## 2020-02-19 DIAGNOSIS — H52223 Regular astigmatism, bilateral: Secondary | ICD-10-CM | POA: Diagnosis not present

## 2020-02-26 DIAGNOSIS — M25512 Pain in left shoulder: Secondary | ICD-10-CM | POA: Diagnosis not present

## 2020-04-23 ENCOUNTER — Encounter: Payer: Self-pay | Admitting: Family Medicine

## 2020-07-03 NOTE — L&D Delivery Note (Signed)
° °  Delivery Note:   G2P1001 at [redacted]w[redacted]d  Admitting diagnosis: Normal labor [O80, Z37.9] Risks:  Hypothyroidism stable on synthroid 75 mcg AMA 40 yo, genetic screens wnl Superficial varicosities in LE's, no hx phlebitis Mild anemia of pregnancy  First Stage:  Induction of labor:NA Onset of labor: 1800 06/17/2021 Augmentation: N/A ROM: SROM 2327 06/17/2021 clear fluid Active labor onset: 2100 Analgesia /Anesthesia/Pain control intrapartum: Local   Second Stage:  Complete dilation at 06/17/2021  2330 Onset of pushing at 2330 FHR second stage Cat 1   Pushing in L lateral position with CNM and L&D staff support, Larkin Ina present for birth and supportive. Nuchal Cord: No  Delivery of a Live born female  Birth Weight:  3714 g Weight:, English: 8 lb 3 oz APGAR: 9, 9  Newborn Delivery   Birth date/time: 06/17/2021 23:37:00 Delivery type: Vaginal, Spontaneous      in cephalic presentation, position OA to ROT.  Cord double clamped after cessation of pulsation, cut by Mother.  Collection of cord blood for typing completed. Cord blood donation-None  Arterial cord blood sample-No    Third Stage:  Placenta delivered-Spontaneous  with 3 vessels . Uterine tone firm with massage, bleeding moderate to small Uterotonics: none Placenta to L&D.  2nd degree;Perineal  laceration identified.  Episiotomy:None  Local analgesia: 1% lido  Repair:3.0 vicryl in standard fashion Est. Blood Loss (HF):026.37   Complications: None   Mom to postpartum.  Baby unnamed to Couplet care / Skin to Skin.  Delivery Report:  Review the Delivery Report for details.     Signed: Juliene Pina, CNM, MSN 06/18/2021, 12:12 AM

## 2020-12-01 ENCOUNTER — Other Ambulatory Visit (HOSPITAL_COMMUNITY): Payer: Self-pay

## 2020-12-01 MED ORDER — LEVOTHYROXINE SODIUM 50 MCG PO TABS
50.0000 ug | ORAL_TABLET | Freq: Every day | ORAL | 1 refills | Status: DC
  Filled 2020-12-01: qty 30, 30d supply, fill #0
  Filled 2020-12-31: qty 30, 30d supply, fill #1

## 2020-12-02 ENCOUNTER — Other Ambulatory Visit (HOSPITAL_COMMUNITY): Payer: Self-pay

## 2020-12-29 ENCOUNTER — Other Ambulatory Visit (HOSPITAL_COMMUNITY): Payer: Self-pay

## 2020-12-29 MED ORDER — TERCONAZOLE 0.4 % VA CREA
TOPICAL_CREAM | VAGINAL | 0 refills | Status: DC
  Filled 2020-12-29: qty 45, 7d supply, fill #0

## 2020-12-29 MED ORDER — METRONIDAZOLE 500 MG PO TABS
ORAL_TABLET | ORAL | 0 refills | Status: DC
  Filled 2020-12-29: qty 14, 7d supply, fill #0

## 2020-12-31 ENCOUNTER — Other Ambulatory Visit (HOSPITAL_COMMUNITY): Payer: Self-pay

## 2020-12-31 ENCOUNTER — Ambulatory Visit (HOSPITAL_COMMUNITY)
Admission: EM | Admit: 2020-12-31 | Discharge: 2020-12-31 | Disposition: A | Discharge status: Home / Self Care | Payer: Managed Care, Other (non HMO) | Attending: Physician Assistant | Admitting: Physician Assistant

## 2020-12-31 ENCOUNTER — Ambulatory Visit (HOSPITAL_BASED_OUTPATIENT_CLINIC_OR_DEPARTMENT_OTHER)
Admit: 2020-12-31 | Discharge: 2020-12-31 | Disposition: A | Discharge status: Home / Self Care | Payer: Managed Care, Other (non HMO) | Attending: Physician Assistant | Admitting: Physician Assistant

## 2020-12-31 ENCOUNTER — Other Ambulatory Visit: Payer: Self-pay

## 2020-12-31 ENCOUNTER — Encounter (HOSPITAL_COMMUNITY): Payer: Self-pay | Admitting: Emergency Medicine

## 2020-12-31 DIAGNOSIS — M79605 Pain in left leg: Secondary | ICD-10-CM | POA: Insufficient documentation

## 2020-12-31 DIAGNOSIS — I83812 Varicose veins of left lower extremities with pain: Secondary | ICD-10-CM | POA: Diagnosis present

## 2020-12-31 DIAGNOSIS — Z3A16 16 weeks gestation of pregnancy: Secondary | ICD-10-CM | POA: Insufficient documentation

## 2020-12-31 DIAGNOSIS — O2202 Varicose veins of lower extremity in pregnancy, second trimester: Secondary | ICD-10-CM

## 2020-12-31 NOTE — Discharge Instructions (Addendum)
Believe that your symptoms are related to engorged varicose veins but we need to rule out a DVT given you are pregnant.  Please go have ultrasound as scheduled.  We will contact you if there are any abnormalities.  Keep leg elevated and use compression to help with symptoms.  You can use Tylenol for breakthrough pain.  If you have any chest pain, shortness of breath, increased leg swelling or pain you need to go to the emergency room.

## 2020-12-31 NOTE — ED Triage Notes (Signed)
New raised vasculature on left lower leg and behind left knee. PT reports area is painful and felt warm this morning. Is currently [redacted] weeks pregnant.

## 2020-12-31 NOTE — ED Notes (Signed)
Ultrasound appt scheduled for 1100 today [facility].   Pt expressed understanding.

## 2020-12-31 NOTE — Progress Notes (Signed)
LLE venous duplex has been completed.  Preliminary results messaged to [employer], PA.  Results can be found under chart review under CV PROC. 12/31/2020 12:06 PM Peirce Deveney RVT, RDMS

## 2020-12-31 NOTE — ED Triage Notes (Signed)
Has been prescribed flagyl and yesat infection med- plans to start these in the next day or two
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
## 2021-02-09 ENCOUNTER — Other Ambulatory Visit (HOSPITAL_COMMUNITY): Payer: Self-pay

## 2021-02-09 MED ORDER — LEVOTHYROXINE SODIUM 50 MCG PO TABS
50.0000 ug | ORAL_TABLET | Freq: Every day | ORAL | 1 refills | Status: DC
  Filled 2021-02-09: qty 30, 30d supply, fill #0

## 2021-02-18 ENCOUNTER — Other Ambulatory Visit (HOSPITAL_COMMUNITY): Payer: Self-pay

## 2021-03-04 ENCOUNTER — Other Ambulatory Visit (HOSPITAL_COMMUNITY): Payer: Self-pay

## 2021-03-04 MED ORDER — LEVOTHYROXINE SODIUM 75 MCG PO TABS
ORAL_TABLET | ORAL | 0 refills | Status: DC
  Filled 2021-03-04: qty 30, 30d supply, fill #0

## 2021-03-30 ENCOUNTER — Other Ambulatory Visit (HOSPITAL_COMMUNITY): Payer: Self-pay

## 2021-03-30 MED ORDER — FLUCONAZOLE 150 MG PO TABS
ORAL_TABLET | ORAL | 0 refills | Status: DC
  Filled 2021-03-30: qty 1, 1d supply, fill #0

## 2021-04-08 ENCOUNTER — Other Ambulatory Visit (HOSPITAL_COMMUNITY): Payer: Self-pay

## 2021-04-08 MED ORDER — LEVOTHYROXINE SODIUM 75 MCG PO TABS
ORAL_TABLET | ORAL | 0 refills | Status: DC
  Filled 2021-04-08: qty 30, 30d supply, fill #0

## 2021-04-09 ENCOUNTER — Other Ambulatory Visit (HOSPITAL_COMMUNITY): Payer: Self-pay

## 2021-05-09 ENCOUNTER — Other Ambulatory Visit (HOSPITAL_COMMUNITY): Payer: Self-pay

## 2021-05-09 MED ORDER — LEVOTHYROXINE SODIUM 75 MCG PO TABS
ORAL_TABLET | ORAL | 1 refills | Status: AC
  Filled 2021-05-09: qty 30, 30d supply, fill #0

## 2021-05-09 MED ORDER — FLUCONAZOLE 150 MG PO TABS
ORAL_TABLET | ORAL | 0 refills | Status: DC
  Filled 2021-05-09: qty 1, 1d supply, fill #0

## 2021-06-17 ENCOUNTER — Encounter (HOSPITAL_COMMUNITY): Payer: Self-pay | Admitting: Obstetrics and Gynecology

## 2021-06-17 ENCOUNTER — Inpatient Hospital Stay (HOSPITAL_COMMUNITY)
Admission: AD | Admit: 2021-06-17 | Discharge: 2021-06-19 | DRG: 807 | Disposition: A | Discharge status: Home / Self Care | Payer: Managed Care, Other (non HMO) | Attending: Obstetrics and Gynecology | Admitting: Obstetrics and Gynecology

## 2021-06-17 ENCOUNTER — Other Ambulatory Visit: Payer: Self-pay

## 2021-06-17 DIAGNOSIS — D509 Iron deficiency anemia, unspecified: Secondary | ICD-10-CM | POA: Diagnosis present

## 2021-06-17 DIAGNOSIS — Z20822 Contact with and (suspected) exposure to covid-19: Secondary | ICD-10-CM | POA: Diagnosis present

## 2021-06-17 DIAGNOSIS — E039 Hypothyroidism, unspecified: Secondary | ICD-10-CM | POA: Diagnosis present

## 2021-06-17 DIAGNOSIS — O99284 Endocrine, nutritional and metabolic diseases complicating childbirth: Secondary | ICD-10-CM | POA: Diagnosis present

## 2021-06-17 DIAGNOSIS — Z3A39 39 weeks gestation of pregnancy: Secondary | ICD-10-CM | POA: Diagnosis not present

## 2021-06-17 DIAGNOSIS — O9902 Anemia complicating childbirth: Secondary | ICD-10-CM | POA: Diagnosis present

## 2021-06-17 DIAGNOSIS — O26893 Other specified pregnancy related conditions, third trimester: Secondary | ICD-10-CM | POA: Diagnosis present

## 2021-06-17 LAB — CBC
HCT: 32.2 % — ABNORMAL LOW (ref 36.0–46.0)
Hemoglobin: 10.7 g/dL — ABNORMAL LOW (ref 12.0–15.0)
MCH: 29.4 pg (ref 26.0–34.0)
MCHC: 33.2 g/dL (ref 30.0–36.0)
MCV: 88.5 fL (ref 80.0–100.0)
Platelets: 239 10*3/uL (ref 150–400)
RBC: 3.64 MIL/uL — ABNORMAL LOW (ref 3.87–5.11)
RDW: 13 % (ref 11.5–15.5)
WBC: 9.8 10*3/uL (ref 4.0–10.5)
nRBC: 0 % (ref 0.0–0.2)

## 2021-06-17 LAB — RESP PANEL BY RT-PCR (FLU A&B, COVID) ARPGX2
Influenza A by PCR: NEGATIVE
Influenza B by PCR: NEGATIVE
SARS Coronavirus 2 by RT PCR: NEGATIVE

## 2021-06-17 LAB — TYPE AND SCREEN
ABO/RH(D): AB POS
Antibody Screen: NEGATIVE

## 2021-06-17 MED ORDER — FLEET ENEMA 7-19 GM/118ML RE ENEM: 1.0000 | ENEMA | RECTAL | Status: DC | PRN

## 2021-06-17 MED ORDER — LACTATED RINGERS IV SOLN: INTRAVENOUS | Status: DC

## 2021-06-17 MED ORDER — ONDANSETRON HCL 4 MG/2ML IJ SOLN: 4.0000 mg | Freq: Four times a day (QID) | INTRAMUSCULAR | Status: DC | PRN

## 2021-06-17 MED ORDER — LIDOCAINE HCL (PF) 1 % IJ SOLN
30.0000 mL | INTRAMUSCULAR | Status: DC | PRN
  Administered 2021-06-17: 30 mL via SUBCUTANEOUS
  Filled 2021-06-17: qty 30

## 2021-06-17 MED ORDER — SOD CITRATE-CITRIC ACID 500-334 MG/5ML PO SOLN: 30.0000 mL | ORAL | Status: DC | PRN

## 2021-06-17 MED ORDER — OXYCODONE-ACETAMINOPHEN 5-325 MG PO TABS: 1.0000 | ORAL_TABLET | ORAL | Status: DC | PRN

## 2021-06-17 MED ORDER — LEVOTHYROXINE SODIUM 75 MCG PO TABS
75.0000 ug | ORAL_TABLET | Freq: Every day | ORAL | Status: DC
Start: 2021-06-18 — End: 2021-06-19
  Administered 2021-06-18 – 2021-06-19 (×2): 75 ug via ORAL
  Filled 2021-06-17 (×3): qty 1

## 2021-06-17 MED ORDER — OXYCODONE-ACETAMINOPHEN 5-325 MG PO TABS: 2.0000 | ORAL_TABLET | ORAL | Status: DC | PRN

## 2021-06-17 MED ORDER — ACETAMINOPHEN 325 MG PO TABS: 650.0000 mg | ORAL_TABLET | ORAL | Status: DC | PRN

## 2021-06-17 MED ORDER — OXYTOCIN BOLUS FROM INFUSION: 333.0000 mL | Freq: Once | INTRAVENOUS | Status: DC

## 2021-06-17 MED ORDER — LACTATED RINGERS IV SOLN: 500.0000 mL | INTRAVENOUS | Status: DC | PRN

## 2021-06-17 MED ORDER — OXYTOCIN-SODIUM CHLORIDE 30-0.9 UT/500ML-% IV SOLN: 2.5000 [IU]/h | INTRAVENOUS | Status: DC

## 2021-06-17 NOTE — MAU Note (Signed)
C/o ctx q 3-4 min . Denies any leaking or bleeding . Good fetal movement reported.

## 2021-06-17 NOTE — MAU Note (Signed)
Pt declines IV, provider made aware. Labs drawn prior to transfer to 216
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
## 2021-06-18 ENCOUNTER — Encounter (HOSPITAL_COMMUNITY): Payer: Self-pay | Admitting: Obstetrics and Gynecology

## 2021-06-18 ENCOUNTER — Inpatient Hospital Stay (HOSPITAL_COMMUNITY): Admit: 2021-06-18 | Payer: Self-pay

## 2021-06-18 DIAGNOSIS — O9902 Anemia complicating childbirth: Secondary | ICD-10-CM | POA: Diagnosis present

## 2021-06-18 LAB — CBC
HCT: 29.7 % — ABNORMAL LOW (ref 36.0–46.0)
Hemoglobin: 9.6 g/dL — ABNORMAL LOW (ref 12.0–15.0)
MCH: 28.7 pg (ref 26.0–34.0)
MCHC: 32.3 g/dL (ref 30.0–36.0)
MCV: 88.7 fL (ref 80.0–100.0)
Platelets: 213 10*3/uL (ref 150–400)
RBC: 3.35 MIL/uL — ABNORMAL LOW (ref 3.87–5.11)
RDW: 12.9 % (ref 11.5–15.5)
WBC: 13.9 10*3/uL — ABNORMAL HIGH (ref 4.0–10.5)
nRBC: 0 % (ref 0.0–0.2)

## 2021-06-18 LAB — RPR: RPR Ser Ql: NONREACTIVE

## 2021-06-18 MED ORDER — DIPHENHYDRAMINE HCL 25 MG PO CAPS: 25.0000 mg | ORAL_CAPSULE | Freq: Four times a day (QID) | ORAL | Status: DC | PRN

## 2021-06-18 MED ORDER — ZOLPIDEM TARTRATE 5 MG PO TABS: 5.0000 mg | ORAL_TABLET | Freq: Every evening | ORAL | Status: DC | PRN

## 2021-06-18 MED ORDER — MAGNESIUM OXIDE -MG SUPPLEMENT 400 (240 MG) MG PO TABS
400.0000 mg | ORAL_TABLET | Freq: Every day | ORAL | Status: DC
  Administered 2021-06-18 – 2021-06-19 (×2): 400 mg via ORAL
  Filled 2021-06-18 (×2): qty 1

## 2021-06-18 MED ORDER — DIBUCAINE (PERIANAL) 1 % EX OINT: 1.0000 "application " | TOPICAL_OINTMENT | CUTANEOUS | Status: DC | PRN

## 2021-06-18 MED ORDER — BENZOCAINE-MENTHOL 20-0.5 % EX AERO: 1.0000 "application " | INHALATION_SPRAY | CUTANEOUS | Status: DC | PRN

## 2021-06-18 MED ORDER — SIMETHICONE 80 MG PO CHEW: 80.0000 mg | CHEWABLE_TABLET | ORAL | Status: DC | PRN

## 2021-06-18 MED ORDER — POLYSACCHARIDE IRON COMPLEX 150 MG PO CAPS
150.0000 mg | ORAL_CAPSULE | Freq: Every day | ORAL | Status: DC
  Administered 2021-06-18 – 2021-06-19 (×2): 150 mg via ORAL
  Filled 2021-06-18 (×2): qty 1

## 2021-06-18 MED ORDER — BISACODYL 10 MG RE SUPP: 10.0000 mg | Freq: Every day | RECTAL | Status: DC | PRN

## 2021-06-18 MED ORDER — ONDANSETRON HCL 4 MG/2ML IJ SOLN: 4.0000 mg | INTRAMUSCULAR | Status: DC | PRN

## 2021-06-18 MED ORDER — COCONUT OIL OIL
1.0000 "application " | TOPICAL_OIL | Status: DC | PRN
  Administered 2021-06-18: 1 via TOPICAL

## 2021-06-18 MED ORDER — PRENATAL MULTIVITAMIN CH
1.0000 | ORAL_TABLET | Freq: Every day | ORAL | Status: DC
  Filled 2021-06-18: qty 1

## 2021-06-18 MED ORDER — TETANUS-DIPHTH-ACELL PERTUSSIS 5-2.5-18.5 LF-MCG/0.5 IM SUSY: 0.5000 mL | PREFILLED_SYRINGE | Freq: Once | INTRAMUSCULAR | Status: DC

## 2021-06-18 MED ORDER — FLEET ENEMA 7-19 GM/118ML RE ENEM: 1.0000 | ENEMA | Freq: Every day | RECTAL | Status: DC | PRN

## 2021-06-18 MED ORDER — IBUPROFEN 600 MG PO TABS
600.0000 mg | ORAL_TABLET | Freq: Four times a day (QID) | ORAL | Status: DC
  Filled 2021-06-18: qty 1

## 2021-06-18 MED ORDER — WITCH HAZEL-GLYCERIN EX PADS: 1.0000 "application " | MEDICATED_PAD | CUTANEOUS | Status: DC | PRN

## 2021-06-18 MED ORDER — ONDANSETRON HCL 4 MG PO TABS: 4.0000 mg | ORAL_TABLET | ORAL | Status: DC | PRN

## 2021-06-18 MED ORDER — ACETAMINOPHEN 500 MG PO TABS
1000.0000 mg | ORAL_TABLET | Freq: Four times a day (QID) | ORAL | Status: DC
  Filled 2021-06-18 (×2): qty 2

## 2021-06-18 MED ORDER — SENNOSIDES-DOCUSATE SODIUM 8.6-50 MG PO TABS
2.0000 | ORAL_TABLET | ORAL | Status: DC
  Filled 2021-06-18 (×2): qty 2

## 2021-06-18 NOTE — Progress Notes (Signed)
° ° °  PPD # 1 S/P NSVD  Live born female  Birth Weight: 8 lb 3 oz (3714 g) APGAR: 9, 9  Newborn Delivery   Birth date/time: 06/17/2021 23:37:00 Delivery type: Vaginal, Spontaneous     Baby name: unnamed Delivering provider: Derrell Lolling C  Episiotomy:None   Lacerations:2nd degree;Perineal   Feeding: breast  Pain control at delivery: Local   S:  Reports feeling very well.             Tolerating po/ No nausea or vomiting             Bleeding is light             Pain controlled with acetaminophen and ibuprofen (OTC)             Up ad lib / ambulatory / voiding without difficulties   O:  A & O x 3, in no apparent distress              VS:  Vitals:   06/18/21 0122 06/18/21 0201 06/18/21 0323 06/18/21 0612  BP: 138/73 124/69 126/66 128/73  Pulse: 70 74 79 83  Resp: 18 18 18 18   Temp:  98.5 F (36.9 C) 98.3 F (36.8 C) 98.2 F (36.8 C)  TempSrc:  Oral Oral Oral  SpO2:  99% 99% 99%  Weight:      Height:        LABS:  Recent Labs    06/17/21 2213 06/18/21 0504  WBC 9.8 13.9*  HGB 10.7* 9.6*  HCT 32.2* 29.7*  PLT 239 213    Blood type: --/--/AB POS (12/16 2213)  Rubella:     I&O: I/O last 3 completed shifts: In: -  Out: 250 [Blood:250]          No intake/output data recorded.  Vaccines: TDaP          will consider prior to DC         Flu             declined                    COVID-19 UTD  Gen: AAO x 3, NAD  Abdomen: soft, non-tender, non-distended             Fundus: firm, non-tender, U-1  Perineum: repair intact  Lochia: small  Extremities: trace pedal edema, no calf pain or tenderness    A/P: PPD # 1 40 y.o., [id number]   Principal Problem:   Postpartum care following vaginal delivery 12/16 Active Problems:   Hypothyroidism   SVD (spontaneous vaginal delivery)   Maternal anemia, with delivery - IDA  - started oral Fe and Mag ox  Doing well - stable status  Routine post partum orders  Anticipate discharge tomorrow    Juliene Pina, MSN,  CNM 06/18/2021, 7:30 AM

## 2021-06-18 NOTE — Lactation Note (Signed)
This note was copied from a baby's chart. Lactation Consultation Note  Patient Name: Girl Shekia Kuper [id number] Date: 06/18/2021 Reason for consult: Initial assessment;Term Age:38 hours  Initial lactation consult conducted. I provided education on breast feeding basics. Baby girl (not yet named) has had several good feeding sessions according to Ms. Pauling. Ms. Claycomb would like lactation to observe and assist a feeding for reassurance purposes to make sure baby has appropriate depth of latch. Ms. Kasik has her DEBP in the room. I recommended that she breast feed baby 8-12 times a day on demand and to monitor for feeding cues.   Maternal Data Has patient been taught Hand Expression?: Yes Does the patient have breastfeeding experience prior to this delivery?: Yes How long did the patient breastfeed?: 18 months  Feeding Mother's Current Feeding Choice: Breast Milk   Interventions Interventions: Breast feeding basics reviewed;LC Services brochure;Education  Discharge Pump: Personal  Consult Status Consult Status: Follow-up Date: 06/19/21 Follow-up type: In-patient    Lenore Manner 06/18/2021, 9:17 AM

## 2021-06-19 NOTE — Progress Notes (Signed)
CSW met with MOB to complete consult for mental health. CSW observed MOB sitting at edge of bed, and FOB laying on couch bonding with infant. MOB gave CSW verbal consent to complete consult while FOB was present. CSW explained role, and reason for consult. MOB was pleasant, and polite during engagement with CSW. MOB reported, history of postpartum depression with her first child in 2016. MOB denied any history of psychotropic medication. MOB reported, history of therapy through [facility], and at private practice with Leamon Arnt. CSW encourage MOB to implement healthy coping skills when symptoms arises.   CSW provided education regarding the baby blues period vs. perinatal mood disorders, discussed treatment and gave resources for mental health follow up if concerns arise. CSW recommends self- evaluation during the postpartum time period using the New Mom Checklist from Postpartum Progress and encouraged MOB to contact a medical professional if symptoms are noted at any time.   MOB reported, since delivery she feels, "great". MOB reported, FOB, her family, and friends are very supportive. MOB denied SI, and HI when CSW assessed for safety.   MOB reported, there are no transportation barriers to follow up infant's care. MOB reported, she has all essentials needed to care for infant. MOB reported, infant has a car seat, and bassinet. MOB denied any additional barriers.     CSW provided education on sudden infant death syndrome (SIDS).  CSW provided perinatal mood disorders resources.   CSW identifies no further need for intervention or barriers to discharge at this time.   Zoe [name], MSW, LCSW-A Clinical Social Worker- Weekends [phone]

## 2021-06-19 NOTE — Progress Notes (Addendum)
°  No c/o, tol po, lochia wnl; no pain; voiding w/o difficulty breastfeeding  Patient Vitals for the past 24 hrs:  BP Temp Temp src Pulse Resp SpO2  06/19/21 0557 119/70 98 F (36.7 C) Oral 89 17 98 %  06/18/21 1920 118/67 98.4 F (36.9 C) Oral 74 16 99 %  06/18/21 1400 121/69 98.5 F (36.9 C) Oral 79 18 100 %   A&ox3 Nml respirations Abd: soft, nt, nd; fundus firm and below umb LE: no edema, nt bilat  CBC Latest Ref Rng & Units 06/18/2021 06/17/2021 06/19/2017  WBC 4.0 - 10.5 K/uL 13.9(H) 9.8 5.8  Hemoglobin 12.0 - 15.0 g/dL 9.6(L) 10.7(L) 13.6  Hematocrit 36.0 - 46.0 % 29.7(L) 32.2(L) 40.3  Platelets 150 - 400 K/uL 213 239 210.0   A/P: ppd1 s/p svd Doing well, plan d/c home tomorrow 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   RH pos 4.   Rubella  5.   Hypothyroid - synthroid

## 2021-06-19 NOTE — Lactation Note (Signed)
This note was copied from a baby's chart. Lactation Consultation Note  Patient Name: Zoe [name] MYTRZ'N Date: 06/19/2021 Reason for consult: Follow-up assessment;RN request Age:38 hours  P2, Ex BF.  Stools transitioning.  Mother hand expressed good flow of transitional breastmilk. Noted abrasion on tips of nipples and mother is tender. Repositioned mother to laid back position and latched. Demonstrated how to perform chin tug to widen latch. Mother had improved comfort in this position. Noted frequent swallows. Suggest using coconut oil and ebm for soreness. Reviewed engorgement care and monitoring voids/stools. Call if further assistance is needed.   Feeding Mother's Current Feeding Choice: Breast Milk  LATCH Score Latch: Grasps breast easily, tongue down, lips flanged, rhythmical sucking.  Audible Swallowing: Spontaneous and intermittent  Type of Nipple: Everted at rest and after stimulation  Comfort (Breast/Nipple): Filling, red/small blisters or bruises, mild/mod discomfort  Hold (Positioning): Assistance needed to correctly position infant at breast and maintain latch.  LATCH Score: 8   Lactation Tools Discussed/Used    Interventions Interventions: Breast feeding basics reviewed;Assisted with latch;Skin to skin;Position options;Support pillows;Education  Discharge Discharge Education: Engorgement and breast care;Warning signs for feeding baby  Consult Status Consult Status: Complete Date: 06/19/21    Vivianne Master [facility] 06/19/2021, 8:39 AM

## 2021-06-19 NOTE — Discharge Summary (Signed)
Postpartum Discharge Summary  Date of Service updated     Patient Name: Zoe [name] DOB: 1980/09/07 MRN: [id number]  Date of admission: 06/17/2021 Delivery date:06/17/2021  Delivering provider: Juliene Pina  Date of discharge: 06/19/2021  Admitting diagnosis: Normal labor [O80, Z37.9] Intrauterine pregnancy: [redacted]w[redacted]d     Secondary diagnosis:  Principal Problem:   Postpartum care following vaginal delivery 12/16 Active Problems:   Hypothyroidism   SVD (spontaneous vaginal delivery)   Maternal anemia, with delivery - IDA  Additional problems: none    Discharge diagnosis: Term Pregnancy Delivered                                              Post partum procedures: none Augmentation: N/A Complications: None  Hospital course: Onset of Labor With Vaginal Delivery      40 y.o. yo [id number] at [redacted]w[redacted]d was admitted in Active Labor on 06/17/2021. Patient had an uncomplicated labor course as follows: waterbirth Membrane Rupture Time/Date: 11:27 PM ,06/17/2021   Delivery Method:Vaginal, Spontaneous  Episiotomy: None  Lacerations:  2nd degree;Perineal  Patient had an uncomplicated postpartum course.  She is ambulating, tolerating a regular diet, passing flatus, and urinating well. Patient is discharged home in stable condition on 06/19/21.  Newborn Data: Birth date:06/17/2021  Birth time:11:37 PM  Gender:Female  Living status:Living  Apgars:9 ,9  Weight:3714 g    Physical exam  Vitals:   06/18/21 0930 06/18/21 1400 06/18/21 1920 06/19/21 0557  BP: 123/70 121/69 118/67 119/70  Pulse: 85 79 74 89  Resp: 18 18 16 17   Temp: 98.1 F (36.7 C) 98.5 F (36.9 C) 98.4 F (36.9 C) 98 F (36.7 C)  TempSrc: Oral Oral Oral Oral  SpO2: 100% 100% 99% 98%  Weight:      Height:       General: alert, cooperative, and no distress Lochia: appropriate Uterine Fundus: firm Incision: N/A DVT Evaluation: No evidence of DVT seen on physical exam. Labs: Lab Results  Component Value  Date   WBC 13.9 (H) 06/18/2021   HGB 9.6 (L) 06/18/2021   HCT 29.7 (L) 06/18/2021   MCV 88.7 06/18/2021   PLT 213 06/18/2021   CMP Latest Ref Rng & Units 06/24/2019  Glucose 70 - 99 mg/dL 104(H)  BUN 6 - 23 mg/dL -  Creatinine 0.40 - 1.20 mg/dL -  Sodium 135 - 145 mEq/L -  Potassium 3.5 - 5.1 mEq/L -  Chloride 96 - 112 mEq/L -  CO2 19 - 32 mEq/L -  Calcium 8.4 - 10.5 mg/dL -   Edinburgh Score: Edinburgh Postnatal Depression Scale Screening Tool 06/18/2021  I have been able to laugh and see the funny side of things. 0  I have looked forward with enjoyment to things. 0  I have blamed myself unnecessarily when things went wrong. 1  I have been anxious or worried for no good reason. 0  I have felt scared or panicky for no good reason. 0  Things have been getting on top of me. 0  I have been so unhappy that I have had difficulty sleeping. 0  I have felt sad or miserable. 1  I have been so unhappy that I have been crying. 0  The thought of harming myself has occurred to me. 0  Edinburgh Postnatal Depression Scale Total 2      After visit meds:  Allergies as of 06/19/2021   No Known Allergies      Medication List     TAKE these medications    levothyroxine 75 MCG tablet Commonly known as: SYNTHROID Take 1 tablet by mouth every day for 30 days.   Vitamin D 50 MCG (2000 UT) tablet Take 2 tablets (4,000 Units total) by mouth daily.         Discharge home in stable condition Infant Feeding: Breast Infant Disposition:home with mother Discharge instruction: per After Visit Summary and Postpartum booklet. Activity: Advance as tolerated. Pelvic rest for 6 weeks.  Diet: routine diet Anticipated Birth Control: Unsure Postpartum Appointment:6 weeks Additional Postpartum F/U:  2 wk, h/o pp depression Future Appointments:No future appointments. Follow up Visit:  Follow-up Information     Juliene Pina, CNM Follow up.   Specialty: Obstetrics and Gynecology Contact  information: [address] [place] [address] [phone]                     06/19/2021 Charyl Bigger, MD

## 2021-06-30 ENCOUNTER — Telehealth (HOSPITAL_COMMUNITY): Payer: Self-pay | Admitting: *Deleted

## 2021-06-30 NOTE — Telephone Encounter (Signed)
Phone voicemail message left to return nurse call.  Odis Hollingshead, RN 06-30-2021 at 3:55pm

## 2021-07-05 ENCOUNTER — Other Ambulatory Visit (HOSPITAL_COMMUNITY): Payer: Self-pay

## 2021-07-05 MED ORDER — LEVOTHYROXINE SODIUM 75 MCG PO TABS
75.0000 ug | ORAL_TABLET | Freq: Every day | ORAL | 0 refills | Status: AC
  Filled 2021-07-05: qty 30, 30d supply, fill #0

## 2021-07-06 ENCOUNTER — Other Ambulatory Visit (HOSPITAL_COMMUNITY): Payer: Self-pay

## 2021-07-20 ENCOUNTER — Other Ambulatory Visit (HOSPITAL_COMMUNITY): Payer: Self-pay

## 2021-09-23 ENCOUNTER — Other Ambulatory Visit (HOSPITAL_COMMUNITY): Payer: Self-pay

## 2021-09-23 MED ORDER — LEVOTHYROXINE SODIUM 25 MCG PO TABS
ORAL_TABLET | ORAL | 2 refills | Status: AC
  Filled 2021-09-23: qty 30, 30d supply, fill #0

## 2022-09-14 ENCOUNTER — Other Ambulatory Visit (HOSPITAL_COMMUNITY): Payer: Self-pay

## 2022-09-14 MED ORDER — PENICILLIN V POTASSIUM 500 MG PO TABS
500.0000 mg | ORAL_TABLET | Freq: Two times a day (BID) | ORAL | 0 refills | Status: AC
  Filled 2022-09-14: qty 20, 10d supply, fill #0

## 2023-11-07 DIAGNOSIS — R5383 Other fatigue: Secondary | ICD-10-CM | POA: Diagnosis not present

## 2023-12-03 DIAGNOSIS — E039 Hypothyroidism, unspecified: Secondary | ICD-10-CM | POA: Diagnosis not present

## 2024-01-08 DIAGNOSIS — E039 Hypothyroidism, unspecified: Secondary | ICD-10-CM | POA: Diagnosis not present

## 2024-02-11 DIAGNOSIS — E039 Hypothyroidism, unspecified: Secondary | ICD-10-CM | POA: Diagnosis not present

## 2024-02-11 DIAGNOSIS — Z0000 Encounter for general adult medical examination without abnormal findings: Secondary | ICD-10-CM | POA: Diagnosis not present

## 2024-02-11 DIAGNOSIS — Z01419 Encounter for gynecological examination (general) (routine) without abnormal findings: Secondary | ICD-10-CM | POA: Diagnosis not present

## 2024-02-25 DIAGNOSIS — Z0000 Encounter for general adult medical examination without abnormal findings: Secondary | ICD-10-CM | POA: Diagnosis not present

## 2024-02-25 DIAGNOSIS — Z13220 Encounter for screening for lipoid disorders: Secondary | ICD-10-CM | POA: Diagnosis not present

## 2024-02-25 DIAGNOSIS — Z131 Encounter for screening for diabetes mellitus: Secondary | ICD-10-CM | POA: Diagnosis not present

## 2024-02-25 DIAGNOSIS — Z1329 Encounter for screening for other suspected endocrine disorder: Secondary | ICD-10-CM | POA: Diagnosis not present

## 2024-03-27 DIAGNOSIS — E039 Hypothyroidism, unspecified: Secondary | ICD-10-CM | POA: Diagnosis not present

## 2024-03-27 DIAGNOSIS — R7401 Elevation of levels of liver transaminase levels: Secondary | ICD-10-CM | POA: Diagnosis not present
